# Patient Record
Sex: Female | Born: 1965 | Race: Black or African American | Hispanic: No | Smoking: Never smoker
Health system: Southern US, Community
[De-identification: ages and names within clinical notes are randomized; demographics above are authoritative.]

## PROBLEM LIST (undated history)

## (undated) DIAGNOSIS — K219 Gastro-esophageal reflux disease without esophagitis: Secondary | ICD-10-CM

## (undated) DIAGNOSIS — E669 Obesity, unspecified: Secondary | ICD-10-CM

## (undated) DIAGNOSIS — I219 Acute myocardial infarction, unspecified: Secondary | ICD-10-CM

## (undated) DIAGNOSIS — F32A Depression, unspecified: Secondary | ICD-10-CM

## (undated) DIAGNOSIS — M1990 Unspecified osteoarthritis, unspecified site: Secondary | ICD-10-CM

## (undated) DIAGNOSIS — F329 Major depressive disorder, single episode, unspecified: Secondary | ICD-10-CM

## (undated) DIAGNOSIS — I10 Essential (primary) hypertension: Secondary | ICD-10-CM

---

## 1997-12-20 HISTORY — PX: TUBAL LIGATION: SHX77

## 1999-10-19 ENCOUNTER — Emergency Department (HOSPITAL_COMMUNITY): Admission: EM | Admit: 1999-10-19 | Discharge: 1999-10-19 | Payer: Self-pay | Admitting: Emergency Medicine

## 2001-09-25 ENCOUNTER — Emergency Department (HOSPITAL_COMMUNITY): Admission: EM | Admit: 2001-09-25 | Discharge: 2001-09-26 | Payer: Self-pay | Admitting: Emergency Medicine

## 2002-05-04 ENCOUNTER — Emergency Department (HOSPITAL_COMMUNITY): Admission: EM | Admit: 2002-05-04 | Discharge: 2002-05-05 | Payer: Self-pay | Admitting: *Deleted

## 2002-12-20 HISTORY — PX: FRACTURE SURGERY: SHX138

## 2003-02-24 ENCOUNTER — Emergency Department (HOSPITAL_COMMUNITY): Admission: EM | Admit: 2003-02-24 | Discharge: 2003-02-25 | Payer: Self-pay | Admitting: *Deleted

## 2003-02-25 ENCOUNTER — Encounter: Payer: Self-pay | Admitting: Emergency Medicine

## 2003-03-01 ENCOUNTER — Ambulatory Visit (HOSPITAL_BASED_OUTPATIENT_CLINIC_OR_DEPARTMENT_OTHER): Admission: RE | Admit: 2003-03-01 | Discharge: 2003-03-01 | Payer: Self-pay | Admitting: Orthopedic Surgery

## 2003-03-20 ENCOUNTER — Encounter: Admission: RE | Admit: 2003-03-20 | Discharge: 2003-06-18 | Payer: Self-pay | Admitting: Orthopedic Surgery

## 2003-06-28 ENCOUNTER — Emergency Department (HOSPITAL_COMMUNITY): Admission: EM | Admit: 2003-06-28 | Discharge: 2003-06-28 | Payer: Self-pay | Admitting: Emergency Medicine

## 2003-08-21 ENCOUNTER — Encounter: Payer: Self-pay | Admitting: Internal Medicine

## 2003-08-21 ENCOUNTER — Encounter: Admission: RE | Admit: 2003-08-21 | Discharge: 2003-08-21 | Payer: Self-pay | Admitting: Internal Medicine

## 2003-10-27 ENCOUNTER — Emergency Department (HOSPITAL_COMMUNITY): Admission: EM | Admit: 2003-10-27 | Discharge: 2003-10-28 | Payer: Self-pay | Admitting: Emergency Medicine

## 2003-12-10 ENCOUNTER — Other Ambulatory Visit: Admission: RE | Admit: 2003-12-10 | Discharge: 2003-12-10 | Payer: Self-pay | Admitting: Obstetrics and Gynecology

## 2004-11-11 ENCOUNTER — Emergency Department (HOSPITAL_COMMUNITY): Admission: EM | Admit: 2004-11-11 | Discharge: 2004-11-11 | Payer: Self-pay | Admitting: Emergency Medicine

## 2005-05-20 ENCOUNTER — Emergency Department (HOSPITAL_COMMUNITY): Admission: EM | Admit: 2005-05-20 | Discharge: 2005-05-20 | Payer: Self-pay | Admitting: Emergency Medicine

## 2005-08-26 ENCOUNTER — Emergency Department (HOSPITAL_COMMUNITY): Admission: EM | Admit: 2005-08-26 | Discharge: 2005-08-26 | Payer: Self-pay | Admitting: *Deleted

## 2005-12-20 ENCOUNTER — Encounter (INDEPENDENT_AMBULATORY_CARE_PROVIDER_SITE_OTHER): Payer: Self-pay | Admitting: Internal Medicine

## 2005-12-20 LAB — CONVERTED CEMR LAB

## 2006-05-05 ENCOUNTER — Emergency Department (HOSPITAL_COMMUNITY): Admission: EM | Admit: 2006-05-05 | Discharge: 2006-05-05 | Payer: Self-pay | Admitting: Emergency Medicine

## 2006-07-14 ENCOUNTER — Emergency Department (HOSPITAL_COMMUNITY): Admission: EM | Admit: 2006-07-14 | Discharge: 2006-07-14 | Payer: Self-pay | Admitting: Emergency Medicine

## 2007-01-17 ENCOUNTER — Emergency Department (HOSPITAL_COMMUNITY): Admission: EM | Admit: 2007-01-17 | Discharge: 2007-01-17 | Payer: Self-pay | Admitting: Emergency Medicine

## 2007-03-17 ENCOUNTER — Ambulatory Visit: Payer: Self-pay | Admitting: Internal Medicine

## 2007-03-24 ENCOUNTER — Ambulatory Visit: Payer: Self-pay | Admitting: Internal Medicine

## 2007-04-21 ENCOUNTER — Ambulatory Visit: Payer: Self-pay | Admitting: Internal Medicine

## 2007-07-04 ENCOUNTER — Ambulatory Visit: Payer: Self-pay | Admitting: Internal Medicine

## 2007-07-13 ENCOUNTER — Ambulatory Visit (HOSPITAL_COMMUNITY): Admission: RE | Admit: 2007-07-13 | Discharge: 2007-07-13 | Payer: Self-pay | Admitting: Internal Medicine

## 2007-07-13 ENCOUNTER — Encounter (INDEPENDENT_AMBULATORY_CARE_PROVIDER_SITE_OTHER): Payer: Self-pay | Admitting: Internal Medicine

## 2007-07-14 ENCOUNTER — Ambulatory Visit: Payer: Self-pay | Admitting: Internal Medicine

## 2007-08-18 ENCOUNTER — Ambulatory Visit: Payer: Self-pay | Admitting: *Deleted

## 2007-09-01 ENCOUNTER — Telehealth (INDEPENDENT_AMBULATORY_CARE_PROVIDER_SITE_OTHER): Payer: Self-pay | Admitting: Internal Medicine

## 2007-09-05 ENCOUNTER — Encounter (INDEPENDENT_AMBULATORY_CARE_PROVIDER_SITE_OTHER): Payer: Self-pay | Admitting: Internal Medicine

## 2007-09-05 DIAGNOSIS — E669 Obesity, unspecified: Secondary | ICD-10-CM

## 2007-09-05 DIAGNOSIS — N739 Female pelvic inflammatory disease, unspecified: Secondary | ICD-10-CM | POA: Insufficient documentation

## 2007-09-05 DIAGNOSIS — K219 Gastro-esophageal reflux disease without esophagitis: Secondary | ICD-10-CM | POA: Insufficient documentation

## 2007-09-28 ENCOUNTER — Emergency Department (HOSPITAL_COMMUNITY): Admission: EM | Admit: 2007-09-28 | Discharge: 2007-09-28 | Payer: Self-pay | Admitting: Emergency Medicine

## 2007-10-17 ENCOUNTER — Telehealth (INDEPENDENT_AMBULATORY_CARE_PROVIDER_SITE_OTHER): Payer: Self-pay | Admitting: Internal Medicine

## 2007-11-09 ENCOUNTER — Telehealth (INDEPENDENT_AMBULATORY_CARE_PROVIDER_SITE_OTHER): Payer: Self-pay | Admitting: Internal Medicine

## 2007-11-30 ENCOUNTER — Ambulatory Visit: Payer: Self-pay | Admitting: Internal Medicine

## 2007-11-30 DIAGNOSIS — M7710 Lateral epicondylitis, unspecified elbow: Secondary | ICD-10-CM | POA: Insufficient documentation

## 2007-11-30 DIAGNOSIS — M169 Osteoarthritis of hip, unspecified: Secondary | ICD-10-CM | POA: Insufficient documentation

## 2007-11-30 DIAGNOSIS — R0989 Other specified symptoms and signs involving the circulatory and respiratory systems: Secondary | ICD-10-CM

## 2007-11-30 DIAGNOSIS — R0609 Other forms of dyspnea: Secondary | ICD-10-CM | POA: Insufficient documentation

## 2007-11-30 DIAGNOSIS — M1710 Unilateral primary osteoarthritis, unspecified knee: Secondary | ICD-10-CM | POA: Insufficient documentation

## 2008-01-05 ENCOUNTER — Encounter (INDEPENDENT_AMBULATORY_CARE_PROVIDER_SITE_OTHER): Payer: Self-pay | Admitting: Internal Medicine

## 2008-01-05 ENCOUNTER — Ambulatory Visit (HOSPITAL_BASED_OUTPATIENT_CLINIC_OR_DEPARTMENT_OTHER): Admission: RE | Admit: 2008-01-05 | Discharge: 2008-01-05 | Payer: Self-pay | Admitting: Internal Medicine

## 2008-01-12 ENCOUNTER — Ambulatory Visit: Payer: Self-pay | Admitting: Internal Medicine

## 2008-02-03 ENCOUNTER — Telehealth (INDEPENDENT_AMBULATORY_CARE_PROVIDER_SITE_OTHER): Payer: Self-pay | Admitting: Internal Medicine

## 2008-05-10 ENCOUNTER — Ambulatory Visit: Payer: Self-pay | Admitting: Internal Medicine

## 2008-05-10 DIAGNOSIS — R079 Chest pain, unspecified: Secondary | ICD-10-CM | POA: Insufficient documentation

## 2008-05-10 DIAGNOSIS — I10 Essential (primary) hypertension: Secondary | ICD-10-CM | POA: Insufficient documentation

## 2008-05-10 DIAGNOSIS — F341 Dysthymic disorder: Secondary | ICD-10-CM | POA: Insufficient documentation

## 2008-05-14 ENCOUNTER — Encounter (INDEPENDENT_AMBULATORY_CARE_PROVIDER_SITE_OTHER): Payer: Self-pay | Admitting: Internal Medicine

## 2008-05-19 ENCOUNTER — Encounter (INDEPENDENT_AMBULATORY_CARE_PROVIDER_SITE_OTHER): Payer: Self-pay | Admitting: Internal Medicine

## 2008-05-19 LAB — CONVERTED CEMR LAB
ALT: 15 units/L (ref 0–35)
AST: 15 units/L (ref 0–37)
Albumin: 3.9 g/dL (ref 3.5–5.2)
Basophils Absolute: 0 10*3/uL (ref 0.0–0.1)
Basophils Relative: 0 % (ref 0–1)
CO2: 22 meq/L (ref 19–32)
Cholesterol: 156 mg/dL (ref 0–200)
Creatinine, Ser: 0.59 mg/dL (ref 0.40–1.20)
Eosinophils Absolute: 0.1 10*3/uL (ref 0.0–0.7)
Glucose, Bld: 85 mg/dL (ref 70–99)
HCT: 37.4 % (ref 36.0–46.0)
Hemoglobin: 11.9 g/dL — ABNORMAL LOW (ref 12.0–15.0)
MCV: 82.4 fL (ref 78.0–100.0)
Monocytes Absolute: 0.7 10*3/uL (ref 0.1–1.0)
Neutro Abs: 3.1 10*3/uL (ref 1.7–7.7)
RBC: 4.54 M/uL (ref 3.87–5.11)
Total CHOL/HDL Ratio: 3.8
Total Protein: 7.9 g/dL (ref 6.0–8.3)
VLDL: 29 mg/dL (ref 0–40)
WBC: 7.2 10*3/uL (ref 4.0–10.5)

## 2008-05-20 ENCOUNTER — Ambulatory Visit: Payer: Self-pay | Admitting: Internal Medicine

## 2008-05-21 ENCOUNTER — Encounter (INDEPENDENT_AMBULATORY_CARE_PROVIDER_SITE_OTHER): Payer: Self-pay | Admitting: Internal Medicine

## 2008-06-02 DIAGNOSIS — D509 Iron deficiency anemia, unspecified: Secondary | ICD-10-CM | POA: Insufficient documentation

## 2008-06-02 LAB — CONVERTED CEMR LAB: Vitamin B-12: 619 pg/mL (ref 211–911)

## 2008-07-19 ENCOUNTER — Ambulatory Visit: Payer: Self-pay | Admitting: Internal Medicine

## 2008-07-19 DIAGNOSIS — E785 Hyperlipidemia, unspecified: Secondary | ICD-10-CM

## 2008-07-19 LAB — CONVERTED CEMR LAB
Basophils Absolute: 0 10*3/uL (ref 0.0–0.1)
Eosinophils Absolute: 0.2 10*3/uL (ref 0.0–0.7)
HCT: 38.6 % (ref 36.0–46.0)
Hemoglobin: 12.9 g/dL (ref 12.0–15.0)
Lymphs Abs: 3 10*3/uL (ref 0.7–4.0)
MCV: 82 fL (ref 78.0–100.0)
Monocytes Absolute: 0.4 10*3/uL (ref 0.1–1.0)
Neutrophils Relative %: 47 % (ref 43–77)
OCCULT 1: NEGATIVE
OCCULT 2: NEGATIVE
RDW: 16.6 % — ABNORMAL HIGH (ref 11.5–15.5)

## 2008-07-27 ENCOUNTER — Encounter (INDEPENDENT_AMBULATORY_CARE_PROVIDER_SITE_OTHER): Payer: Self-pay | Admitting: Internal Medicine

## 2008-09-30 ENCOUNTER — Telehealth (INDEPENDENT_AMBULATORY_CARE_PROVIDER_SITE_OTHER): Payer: Self-pay | Admitting: Internal Medicine

## 2008-10-01 ENCOUNTER — Encounter (INDEPENDENT_AMBULATORY_CARE_PROVIDER_SITE_OTHER): Payer: Self-pay | Admitting: Internal Medicine

## 2008-10-01 ENCOUNTER — Ambulatory Visit: Payer: Self-pay | Admitting: Nurse Practitioner

## 2008-10-01 DIAGNOSIS — N898 Other specified noninflammatory disorders of vagina: Secondary | ICD-10-CM | POA: Insufficient documentation

## 2008-10-01 LAB — CONVERTED CEMR LAB
Bilirubin Urine: NEGATIVE
Blood in Urine, dipstick: NEGATIVE
Ketones, urine, test strip: NEGATIVE
Specific Gravity, Urine: >=1.03
Whiff Test: NEGATIVE
pH: 5

## 2008-10-09 ENCOUNTER — Encounter (INDEPENDENT_AMBULATORY_CARE_PROVIDER_SITE_OTHER): Payer: Self-pay | Admitting: *Deleted

## 2008-10-28 ENCOUNTER — Telehealth (INDEPENDENT_AMBULATORY_CARE_PROVIDER_SITE_OTHER): Payer: Self-pay | Admitting: Internal Medicine

## 2008-10-29 ENCOUNTER — Ambulatory Visit: Payer: Self-pay | Admitting: Internal Medicine

## 2008-11-25 ENCOUNTER — Telehealth (INDEPENDENT_AMBULATORY_CARE_PROVIDER_SITE_OTHER): Payer: Self-pay | Admitting: Internal Medicine

## 2008-12-08 ENCOUNTER — Emergency Department (HOSPITAL_COMMUNITY): Admission: EM | Admit: 2008-12-08 | Discharge: 2008-12-08 | Payer: Self-pay | Admitting: Emergency Medicine

## 2008-12-09 ENCOUNTER — Telehealth (INDEPENDENT_AMBULATORY_CARE_PROVIDER_SITE_OTHER): Payer: Self-pay | Admitting: Internal Medicine

## 2008-12-10 ENCOUNTER — Ambulatory Visit: Payer: Self-pay | Admitting: Nurse Practitioner

## 2008-12-10 DIAGNOSIS — N939 Abnormal uterine and vaginal bleeding, unspecified: Secondary | ICD-10-CM

## 2008-12-10 DIAGNOSIS — N926 Irregular menstruation, unspecified: Secondary | ICD-10-CM | POA: Insufficient documentation

## 2008-12-10 LAB — CONVERTED CEMR LAB
Basophils Absolute: 0 10*3/uL (ref 0.0–0.1)
Basophils Relative: 0 % (ref 0–1)
Lymphocytes Relative: 40 % (ref 12–46)
MCHC: 32 g/dL (ref 30.0–36.0)
Neutro Abs: 3.1 10*3/uL (ref 1.7–7.7)
Neutrophils Relative %: 47 % (ref 43–77)
Platelets: 456 10*3/uL — ABNORMAL HIGH (ref 150–400)
RDW: 15.1 % (ref 11.5–15.5)

## 2008-12-24 ENCOUNTER — Ambulatory Visit (HOSPITAL_COMMUNITY): Admission: RE | Admit: 2008-12-24 | Discharge: 2008-12-24 | Payer: Self-pay | Admitting: Internal Medicine

## 2009-01-07 ENCOUNTER — Ambulatory Visit: Payer: Self-pay | Admitting: Internal Medicine

## 2009-01-29 ENCOUNTER — Encounter (INDEPENDENT_AMBULATORY_CARE_PROVIDER_SITE_OTHER): Payer: Self-pay | Admitting: Internal Medicine

## 2009-02-13 ENCOUNTER — Encounter (INDEPENDENT_AMBULATORY_CARE_PROVIDER_SITE_OTHER): Payer: Self-pay | Admitting: Internal Medicine

## 2009-02-28 ENCOUNTER — Encounter (INDEPENDENT_AMBULATORY_CARE_PROVIDER_SITE_OTHER): Payer: Self-pay | Admitting: Internal Medicine

## 2009-03-10 ENCOUNTER — Telehealth (INDEPENDENT_AMBULATORY_CARE_PROVIDER_SITE_OTHER): Payer: Self-pay | Admitting: Internal Medicine

## 2009-03-14 ENCOUNTER — Telehealth (INDEPENDENT_AMBULATORY_CARE_PROVIDER_SITE_OTHER): Payer: Self-pay | Admitting: Internal Medicine

## 2009-03-20 ENCOUNTER — Encounter (INDEPENDENT_AMBULATORY_CARE_PROVIDER_SITE_OTHER): Payer: Self-pay | Admitting: *Deleted

## 2009-04-03 ENCOUNTER — Emergency Department (HOSPITAL_COMMUNITY): Admission: EM | Admit: 2009-04-03 | Discharge: 2009-04-03 | Payer: Self-pay | Admitting: Family Medicine

## 2009-06-12 ENCOUNTER — Ambulatory Visit: Payer: Self-pay | Admitting: Obstetrics and Gynecology

## 2009-06-12 ENCOUNTER — Other Ambulatory Visit: Admission: RE | Admit: 2009-06-12 | Discharge: 2009-06-12 | Payer: Self-pay | Admitting: Obstetrics and Gynecology

## 2009-06-19 ENCOUNTER — Ambulatory Visit: Payer: Self-pay | Admitting: Internal Medicine

## 2009-08-06 ENCOUNTER — Emergency Department (HOSPITAL_COMMUNITY): Admission: EM | Admit: 2009-08-06 | Discharge: 2009-08-06 | Payer: Self-pay | Admitting: Emergency Medicine

## 2009-10-27 ENCOUNTER — Encounter (INDEPENDENT_AMBULATORY_CARE_PROVIDER_SITE_OTHER): Payer: Self-pay | Admitting: Internal Medicine

## 2009-10-27 DIAGNOSIS — D259 Leiomyoma of uterus, unspecified: Secondary | ICD-10-CM | POA: Insufficient documentation

## 2010-01-01 ENCOUNTER — Ambulatory Visit: Payer: Self-pay | Admitting: Internal Medicine

## 2010-01-22 LAB — CONVERTED CEMR LAB
ALT: 14 units/L (ref 0–35)
Albumin: 4 g/dL (ref 3.5–5.2)
Alkaline Phosphatase: 78 units/L (ref 39–117)
Calcium: 9.2 mg/dL (ref 8.4–10.5)
Eosinophils Relative: 3 % (ref 0–5)
Glucose, Bld: 74 mg/dL (ref 70–99)
HCT: 35 % — ABNORMAL LOW (ref 36.0–46.0)
Lymphs Abs: 3 10*3/uL (ref 0.7–4.0)
MCHC: 32.3 g/dL (ref 30.0–36.0)
Monocytes Relative: 10 % (ref 3–12)
Neutro Abs: 3.8 10*3/uL (ref 1.7–7.7)
Potassium: 4.8 meq/L (ref 3.5–5.3)
RBC: 4.37 M/uL (ref 3.87–5.11)
Total Bilirubin: 0.3 mg/dL (ref 0.3–1.2)
Total Protein: 7.8 g/dL (ref 6.0–8.3)
WBC: 7.8 10*3/uL (ref 4.0–10.5)

## 2010-01-23 ENCOUNTER — Encounter (INDEPENDENT_AMBULATORY_CARE_PROVIDER_SITE_OTHER): Payer: Self-pay | Admitting: *Deleted

## 2010-02-03 ENCOUNTER — Emergency Department (HOSPITAL_COMMUNITY): Admission: EM | Admit: 2010-02-03 | Discharge: 2010-02-03 | Payer: Self-pay | Admitting: Emergency Medicine

## 2010-02-05 ENCOUNTER — Ambulatory Visit: Payer: Self-pay | Admitting: Internal Medicine

## 2010-02-05 LAB — CONVERTED CEMR LAB
Bilirubin Urine: NEGATIVE
Blood in Urine, dipstick: NEGATIVE
GC Probe Amp, Genital: NEGATIVE
Glucose, Urine, Semiquant: NEGATIVE
Ketones, urine, test strip: NEGATIVE
Nitrite: NEGATIVE
Urobilinogen, UA: 0.2
WBC Urine, dipstick: NEGATIVE
Whiff Test: NEGATIVE
pH: 5

## 2010-02-06 ENCOUNTER — Ambulatory Visit: Payer: Self-pay | Admitting: Internal Medicine

## 2010-02-09 ENCOUNTER — Ambulatory Visit: Payer: Self-pay | Admitting: Internal Medicine

## 2010-02-23 ENCOUNTER — Encounter (INDEPENDENT_AMBULATORY_CARE_PROVIDER_SITE_OTHER): Payer: Self-pay | Admitting: Internal Medicine

## 2010-02-23 ENCOUNTER — Ambulatory Visit: Payer: Self-pay | Admitting: Nurse Practitioner

## 2010-02-25 ENCOUNTER — Ambulatory Visit: Payer: Self-pay | Admitting: Internal Medicine

## 2010-03-05 ENCOUNTER — Ambulatory Visit: Payer: Self-pay | Admitting: Internal Medicine

## 2010-03-26 ENCOUNTER — Telehealth (INDEPENDENT_AMBULATORY_CARE_PROVIDER_SITE_OTHER): Payer: Self-pay | Admitting: Internal Medicine

## 2010-03-26 ENCOUNTER — Ambulatory Visit: Payer: Self-pay | Admitting: Internal Medicine

## 2010-04-01 ENCOUNTER — Emergency Department (HOSPITAL_COMMUNITY): Admission: EM | Admit: 2010-04-01 | Discharge: 2010-04-01 | Payer: Self-pay | Admitting: Emergency Medicine

## 2010-04-07 ENCOUNTER — Encounter: Admission: RE | Admit: 2010-04-07 | Discharge: 2010-05-12 | Payer: Self-pay | Admitting: Orthopedic Surgery

## 2010-04-23 ENCOUNTER — Ambulatory Visit: Payer: Self-pay | Admitting: Internal Medicine

## 2010-05-11 ENCOUNTER — Ambulatory Visit: Payer: Self-pay | Admitting: Internal Medicine

## 2010-05-28 ENCOUNTER — Ambulatory Visit: Payer: Self-pay | Admitting: Internal Medicine

## 2010-05-28 DIAGNOSIS — J019 Acute sinusitis, unspecified: Secondary | ICD-10-CM | POA: Insufficient documentation

## 2010-06-09 ENCOUNTER — Ambulatory Visit: Payer: Self-pay | Admitting: Internal Medicine

## 2010-06-30 ENCOUNTER — Ambulatory Visit: Payer: Self-pay | Admitting: Internal Medicine

## 2010-06-30 DIAGNOSIS — R071 Chest pain on breathing: Secondary | ICD-10-CM

## 2010-06-30 DIAGNOSIS — M76899 Other specified enthesopathies of unspecified lower limb, excluding foot: Secondary | ICD-10-CM | POA: Insufficient documentation

## 2010-06-30 LAB — CONVERTED CEMR LAB: LDL Goal: 160 mg/dL

## 2010-07-21 ENCOUNTER — Ambulatory Visit: Payer: Self-pay | Admitting: Internal Medicine

## 2010-07-26 ENCOUNTER — Emergency Department: Payer: Self-pay | Admitting: Emergency Medicine

## 2010-08-17 ENCOUNTER — Ambulatory Visit: Payer: Self-pay | Admitting: Internal Medicine

## 2010-08-20 ENCOUNTER — Ambulatory Visit: Payer: Self-pay | Admitting: Physician Assistant

## 2010-09-18 ENCOUNTER — Telehealth (INDEPENDENT_AMBULATORY_CARE_PROVIDER_SITE_OTHER): Payer: Self-pay | Admitting: Internal Medicine

## 2010-11-27 ENCOUNTER — Ambulatory Visit: Payer: Self-pay | Admitting: Internal Medicine

## 2011-01-06 ENCOUNTER — Ambulatory Visit: Admit: 2011-01-06 | Payer: Self-pay | Admitting: Internal Medicine

## 2011-01-10 ENCOUNTER — Encounter: Payer: Self-pay | Admitting: Internal Medicine

## 2011-01-19 NOTE — Assessment & Plan Note (Signed)
Summary: stomach pain//gk   Vital Signs:  Patient profile:   45 year old female Weight:      252 pounds BMI:     48.58 Temp:     97.5 degrees F Pulse rate:   80 / minute Pulse rhythm:   regular Resp:     20 per minute BP sitting:   186 / 100  (left arm) Cuff size:   large  Vitals Entered By: Vesta Mixer CMA (January 01, 2010 11:20 AM)           CC: Pt is here for stomach pain she staes she has been having the pain since christmas. During christmas she had heavy bleeding from her cycle it stayed on 10 days and she was hurting very bad.. Pt states her period came back  on 3days ago she feels its too soon for her cycle. She only had about 6 or 7 days before it came on again.  Pt states she would like to change her medication for her joints, the Tramadol, she feels the medication isnt doing anything for her.   Is Patient Diabetic? No Pain Assessment Patient in pain? yes     Location: abdomen Intensity: 6 Type: sharp  Does patient need assistance? Functional Status Self care Ambulation Normal   CC:  Pt is here for stomach pain she staes she has been having the pain since christmas. During christmas she had heavy bleeding from her cycle it stayed on 10 days and she was hurting very bad.. Pt states her period came back  on 3days ago she feels its too soon for her cycle. She only had about 6 or 7 days before it came on again.  Pt states she would like to change her medication for her joints, the Tramadol, and she feels the medication isnt doing anything for her.  .  History of Present Illness: 1. Abdominal discomfort/ heavy menstrual bleeding:  Pt. was being followed by Dr. Nicholaus Bloom at  [facility] for this, but her orange card expired and she did not finish up.  Sounds like she did have the endometrial biopsy.  She is not aware of what the biopsy showed.  Pt. states the problems she is having since Christmas is similar to that suffered previously.    2.  Arthritis of hips and  knees:  Went to Nutrition once since sent in summer.  No follow ups.  Pt. not really working on diet.   Naproxen has not helped in past.  Vicodin she would break in half and just take at bedtime.  Tramadol caused nausea and stomach discomfort plus did not help the pain.  3.  Hypertension:  just started back on Avalide in December sometime--had not taken for months.  No chest pain or swelling of ankles.  Allergies (verified): No Known Drug Allergies  Physical Exam  Lungs:  Normal respiratory effort, chest expands symmetrically. Lungs are clear to auscultation, no crackles or wheezes. Heart:  Normal rate and regular rhythm. S1 and S2 normal without gallop, murmur, click, rub or other extra sounds. Extremities:  No edema   Impression & Recommendations:  Problem # 1:  ABNORMAL VAGINAL BLEEDING (ICD-626.9) Refer back to Gynecology.  Problem # 2:  HYPERTENSION (ICD-401.9) Has just recently restarted meds. Her updated medication list for this problem includes:    Avalide 150-12.5 Mg Tabs (Irbesartan-hydrochlorothiazide) .Marland Kitchen... 1tab by mouth once daily  Orders: UA Dipstick w/o Micro (manual) ([address])  Problem # 3:  ANEMIA, IRON DEFICIENCY (ICD-280.9)  Her updated medication list for this problem includes:    Ferrous Sulfate 325 (65 Fe) Mg Tbec (Ferrous sulfate) .Marland Kitchen... 1 tab by mouth daily--not taking  Orders: T-CBC w/Diff ([address])  Problem # 4:  DEGENERATIVE JOINT DISEASE, KNEES, BILATERAL (ICD-715.96) And Hips Switch to Celebrex as long as kidney function fine--pt. to hold med until hears from me. The following medications were removed from the medication list:    Tramadol Hcl 50 Mg Tabs (Tramadol hcl) .Marland Kitchen... 1 tab by mouth two times a day Her updated medication list for this problem includes:    Adult Aspirin Ec Low Strength 81 Mg Tbec (Aspirin) .Marland Kitchen... 1 tab by mouth daily with food    Celebrex 200 Mg Caps (Celecoxib) .Marland Kitchen... 1 cap by mouth daily as needed knee and hip  pain  Complete Medication List: 1)  Avalide 150-12.5 Mg Tabs (Irbesartan-hydrochlorothiazide) .Marland Kitchen.. 1tab by mouth once daily 2)  Protonix 40 Mg Tbec (Pantoprazole sodium) .Marland Kitchen.. 1 tab by mouth two times a day for 2 weeks, then once daily 3)  Adult Aspirin Ec Low Strength 81 Mg Tbec (Aspirin) .Marland Kitchen.. 1 tab by mouth daily with food 4)  Ferrous Sulfate 325 (65 Fe) Mg Tbec (Ferrous sulfate) .Marland Kitchen.. 1 tab by mouth daily 5)  Ventolin Hfa 108 (90 Base) Mcg/act Aers (Albuterol sulfate) .... 2 puffs q 4 hours as needed dyspnea 6)  Celebrex 200 Mg Caps (Celecoxib) .Marland Kitchen.. 1 cap by mouth daily as needed knee and hip pain  Other Orders: T-Comprehensive Metabolic Panel ([id number])  Patient Instructions: 1)  BP check in 1 month--nurse visit only 2)  CPP next available with Dr. Delrae Alfred. 3)  Call and let Dr. Delrae Alfred know in next 2 weeks whether you want a Nutrition referral or not. Prescriptions: PROTONIX 40 MG  TBEC (PANTOPRAZOLE SODIUM) 1 tab by mouth two times a day for 2 weeks, then once daily  #60 x 11   Entered and Authorized by:   Julieanne Manson MD   Signed by:   Julieanne Manson MD on 01/01/2010   Method used:   Faxed to ...       [facility] - [facility] (retail)       [address] [place], [place]  [address]       Ph: [phone]       Fax: [phone]   RxID:   [id number] AVALIDE 150-12.5 MG  TABS (IRBESARTAN-HYDROCHLOROTHIAZIDE) 1tab by mouth once daily  #30 x 11   Entered and Authorized by:   Julieanne Manson MD   Signed by:   Julieanne Manson MD on 01/01/2010   Method used:   Faxed to ...       [facility] - [facility] (retail)       [address] [place], [place]  [address]       Ph: [phone]       Fax: [phone]   RxID:   [id number] CELEBREX 200 MG CAPS (CELECOXIB) 1 cap by mouth daily as needed knee and hip pain  #30 x 2   Entered and Authorized by:   Julieanne Manson MD   Signed by:    Julieanne Manson MD on 01/01/2010   Method used:   Faxed to ...       [facility] - Pharmac (retail)       [address] [place], [place]  [address]  Ph: [phone]       Fax: [phone]   RxID:   [id number]    Vital Signs:  Patient profile:   45 year old female Weight:      252 pounds BMI:     48.58 Temp:     97.5 degrees F Pulse rate:   80 / minute Pulse rhythm:   regular Resp:     20 per minute BP sitting:   186 / 100  (left arm) Cuff size:   large  Vitals Entered By: Vesta Mixer CMA (January 01, 2010 11:20 AM)  Appended Document: stomach pain//gk  Laboratory Results   Urine Tests   Date/Time Reported: January 01, 2010 3:47 PM  Routine Urinalysis   Color: lt. yellow Appearance: Clear Glucose: negative   (Normal Range: Negative) Bilirubin: negative   (Normal Range: Negative) Ketone: negative   (Normal Range: Negative) Spec. Gravity: 1.025   (Normal Range: 1.003-1.035) Blood: large   (Normal Range: Negative) pH: 6.0   (Normal Range: 5.0-8.0) Protein: negative   (Normal Range: Negative) Urobilinogen: 0.2   (Normal Range: 0-1) Nitrite: negative   (Normal Range: Negative) Leukocyte Esterace: negative   (Normal Range: Negative)    Comments: Pt. currently menstruating     Laboratory Results   Urine Tests    Routine Urinalysis   Color: lt. yellow Appearance: Clear Glucose: negative   (Normal Range: Negative) Bilirubin: negative   (Normal Range: Negative) Ketone: negative   (Normal Range: Negative) Spec. Gravity: 1.025   (Normal Range: 1.003-1.035) Blood: large   (Normal Range: Negative) pH: 6.0   (Normal Range: 5.0-8.0) Protein: negative   (Normal Range: Negative) Urobilinogen: 0.2   (Normal Range: 0-1) Nitrite: negative   (Normal Range: Negative) Leukocyte Esterace: negative   (Normal Range: Negative)    Comments: Pt. currently menstruating

## 2011-01-19 NOTE — Progress Notes (Signed)
  Phone Note From Pharmacy   Summary of Call: Request for Tramadol--has not been filled since 02/05/10--need to know why she wants to refill at this point.  Please call and inquire. Initial call taken by: Julieanne Manson MD,  September 18, 2010 8:43 AM  Follow-up for Phone Call        [facility]  Follow-up by: Michelle Nasuti,  September 18, 2010 5:08 PM  Additional Follow-up for Phone Call Additional follow up Details #1::        PT ONLY TAKES MEDS as needed  Additional Follow-up by: Michelle Nasuti,  September 21, 2010 12:29 PM    Additional Follow-up for Phone Call Additional follow up Details #2::    But  I need to know for what pain is she using it?  Julieanne Manson MD  September 23, 2010 1:27 PM   Additional Follow-up for Phone Call Additional follow up Details #3:: Details for Additional Follow-up Action Taken: pts states she using the meds for her R hip/leg pain. Cosandra Plouffe was the one who prescribed the meds originally.   Will go ahead and fill  Julieanne Manson MD  September 25, 2010 2:58 PM    pt aware.... [place] Shannon  September 25, 2010 4:50 PM  Additional Follow-up by: Michelle Nasuti,  September 24, 2010 12:36 PM  Prescriptions: TRAMADOL HCL 50 MG TABS (TRAMADOL HCL) 1 tab by mouth two times a day as needed pain  #60 x 0   Entered and Authorized by:   Julieanne Manson MD   Signed by:   Julieanne Manson MD on 09/25/2010   Method used:   Printed then faxed to ...       [facility] - [facility] (retail)       [address] [place], [place]  [address]       Ph: [phone]       Fax: [phone]   RxID:   [id number]

## 2011-01-19 NOTE — Assessment & Plan Note (Signed)
Summary: 1 month fu on depression//kt   Vital Signs:  Patient profile:   45 year old female Weight:      261 pounds Temp:     98.0 degrees F Pulse rate:   93 / minute Pulse rhythm:   regular Resp:     20 per minute BP sitting:   151 / 90  (left arm) Cuff size:   large  Vitals Entered By: Vesta Mixer CMA (June 30, 2010 3:26 PM) CC: depression f/u.  Started back on celexa, missed an appt with Marchelle Folks, but is scheduled to see her next week., Lipid Management Is Patient Diabetic? No Pain Assessment Patient in pain? yes     Location: legs/chest  Does patient need assistance? Ambulation Normal   CC:  depression f/u.  Started back on celexa, missed an appt with Marchelle Folks, but is scheduled to see her next week., and Lipid Management.  History of Present Illness: 1.  Symptoms of sinusitis did get better with treatment.  Have not recurred with restart of Celexa.  2.  Depression:  Restarted Celexa.  Sleeps well.  Refreshed in morning.  Most of time looks forward to day.  Not working any longer and stress level has decreased.  Is getting out and doing things[facility] and trying to be physically active.  No suicidal ideation.  Forgets things all the time--appointments.    3.  High upper left chest pain:  started a couple of days ago.  Kept her up a bit last night.  Has been helping her mom move recently before this started.  No radiation.  No associated dyspnea.  Has been missing bp med.  Taking asa 81 mg daily.  Lipid Management History:      Positive NCEP/ATP III risk factors include hypertension.  Negative NCEP/ATP III risk factors include female age less than 23 years old, no family history for ischemic heart disease, and non-tobacco-user status.    Allergies (verified): No Known Drug Allergies  Physical Exam  General:  Morbidly obese. Chest Wall:  Tender over high left chest in area of complaint--similar pain. Lungs:  Normal respiratory effort, chest expands symmetrically.  Lungs are clear to auscultation, no crackles or wheezes. Heart:  Normal rate and regular rhythm. S1 and S2 normal without gallop, murmur, click, rub or other extra sounds. Msk:  Tender over right greater trochanter.   Impression & Recommendations:  Problem # 1:  ANXIETY DEPRESSION (ICD-300.4) Not well controlled Increase Celexa to 20 mg  Problem # 2:  BURSITIS, RIGHT HIP (ICD-726.5) Discussed stretching exercises and water exercises  Problem # 3:  CHEST WALL PAIN, ACUTE (ICD-786.52) To try Ibuprofen--call if worse Her updated medication list for this problem includes:    Adult Aspirin Ec Low Strength 81 Mg Tbec (Aspirin) .Marland Kitchen... 1 tab by mouth daily with food  Complete Medication List: 1)  Avalide 150-12.5 Mg Tabs (Irbesartan-hydrochlorothiazide) .Marland Kitchen.. 1tab by mouth once daily 2)  Protonix 40 Mg Tbec (Pantoprazole sodium) .Marland Kitchen.. 1 tab by mouth two times a day for 2 weeks, then once daily 3)  Adult Aspirin Ec Low Strength 81 Mg Tbec (Aspirin) .Marland Kitchen.. 1 tab by mouth daily with food 4)  Ferrous Sulfate 325 (65 Fe) Mg Tbec (Ferrous sulfate) .Marland Kitchen.. 1 tab by mouth daily 5)  One-a-day Weight Smart Advance Tabs (Multiple vitamins-minerals) 6)  Tramadol Hcl 50 Mg Tabs (Tramadol hcl) .Marland Kitchen.. 1 tab by mouth two times a day as needed pain 7)  Azithromycin 250 Mg Tabs (Azithromycin) .... 2  tabs by mouth daily for 6 days 8)  Fluticasone Propionate 50 Mcg/act Susp (Fluticasone propionate) .... 2 sprays each nostril daily 9)  Xyzal 5 Mg Tabs (Levocetirizine dihydrochloride) .Marland Kitchen.. 1 tab by mouth daily 10)  Citalopram Hydrobromide 20 Mg Tabs (Citalopram hydrobromide) .Marland Kitchen.. 1 tab by mouth daily  Lipid Assessment/Plan:      Based on NCEP/ATP III, the patient's risk factor category is "0-1 risk factors".  The patient's lipid goals are as follows: Total cholesterol goal is 200; LDL cholesterol goal is 160; HDL cholesterol goal is 40; Triglyceride goal is 150.     Patient Instructions: 1)  Take Ibuprofen 600 mg with  food every 6 hours for chest discomfort--if worsens, call or go to ED 2)  Follow up with Dr. Delrae Alfred in 1 month --depression and hypertension Prescriptions: CITALOPRAM HYDROBROMIDE 20 MG TABS (CITALOPRAM HYDROBROMIDE) 1 tab by mouth daily  #30 x 6   Entered and Authorized by:   Julieanne Manson MD   Signed by:   Julieanne Manson MD on 06/30/2010   Method used:   Faxed to ...       [facility] - Pharmac (retail)       [address] [place], [place]  [address]       Ph: [phone]       Fax: [phone]   RxID:   [id number]

## 2011-01-19 NOTE — Progress Notes (Signed)
Summary: Office Visit//DEPRESSION SCREENING  Office Visit//DEPRESSION SCREENING   Imported By: Arta Bruce 04/02/2010 12:14:44  _____________________________________________________________________  External Attachment:    Type:   Image     Comment:   External Document

## 2011-01-19 NOTE — Letter (Signed)
Summary: *HSN Results Follow up  [facility]  [address] [place], [place] [address]   Phone: [phone]  Fax: [phone]      01/23/2010   Whitney Boyer [address] [place], [place]  [address]   Dear  Ms. Whitney Boyer,                            ____S.Drinkard,FNP   ____D. Gore,FNP       ____B. McPherson,MD   ____V. Rankins,MD    __xx__E. Mulberry,MD    ____N. Daphine Deutscher, FNP  ____D. Reche Dixon, MD    ____K. Philipp Deputy, MD    ____Other     This letter is to inform you that your recent test(s):  _______Pap Smear    ___xx____Lab Test     _______X-ray    _______ is within acceptable limits  ___xx____ requires a medication change  _______ requires a follow-up lab visit  _______ requires a follow-up visit with your provider   Comments:  You have some mild anemia and a prescription for iron was sent to the [facility] pharmacy.  If you have any questions please give [place] a call.  Thank you.       _________________________________________________________ If you have any questions, please contact our office                     Sincerely,  Vesta Mixer CMA [facility]

## 2011-01-19 NOTE — Letter (Signed)
Summary: AMANDA SUMMARY  AMANDA SUMMARY   Imported By: Arta Bruce 05/14/2010 12:24:45  _____________________________________________________________________  External Attachment:    Type:   Image     Comment:   External Document

## 2011-01-19 NOTE — Assessment & Plan Note (Signed)
Summary: CPP EXAM//GK   Vital Signs:  Patient profile:   45 year old female LMP:     01/26/2010 Weight:      249 pounds Temp:     97.9 degrees F Pulse rate:   88 / minute Pulse rhythm:   regular Resp:     20 per minute Cuff size:   large  Vitals Entered By: Vesta Mixer CMA (February 05, 2010 12:29 PM) CC: CPP Is Patient Diabetic? No Pain Assessment Patient in pain? yes     Location: back Intensity: 5  Does patient need assistance? Ambulation Normal LMP (date): 01/26/2010     Enter LMP: 01/26/2010 Last PAP Result Done   CC:  CPP.  History of Present Illness: 45 yo female here for CPP:  1.  Iron Deficiency Anemia with heavy periods.  Did not yet pick up iron supplement.  Was not aware she [facility]--unable to leave a message on her voicemail regarding this recently.  2.  Knee pain:  would like to go back on Tramadol.  Realized after last visit that she had taken Celebrex previously and did not like the med.  Cannot say why.  Habits & Providers  Alcohol-Tobacco-Diet     Alcohol drinks/day: 0     Tobacco Status: never  Exercise-Depression-Behavior     Drug Use: marijuana--when much younger  Allergies (verified): No Known Drug Allergies  Past History:  Past Medical History: ENCOUNTER FOR LONG-TERM USE OF OTHER MEDICATIONS (ICD-V58.69) ABNORMAL VAGINAL BLEEDING (ICD-626.9) VAGINAL DISCHARGE (ICD-623.5) DYSLIPIDEMIA (ICD-272.4) ANEMIA, IRON DEFICIENCY (ICD-280.9) HYPERTENSION (ICD-401.9) ANXIETY DEPRESSION (ICD-300.4) CHEST PAIN (ICD-786.50) LATERAL EPICONDYLITIS, RIGHT (ICD-726.32) DEGENERATIVE JOINT DISEASE, KNEES, 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  1999:  BTL  Family History: Mother,  39:  Hypertension, asthma Father,died in  30s,  when pt. was young--not sure what was cause of death. 6 Brothers:  1 with hypertension, OSA.  1 with asthma, rest she thinks are healthy 3 Sisters:  1 with hypercholesterolemia, htn, arthritis, gout, GERD.  Other 2 are fairly healthy. 3 Children, 1 died at age 24 yo in a house fire.  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  [facility] Use:  marijuana--when much younger  Review of Systems General:  Energy fair.. Eyes:  Denies blurring. ENT:  Sometimes feels like she doesn't hear things--often turns TV up loudly.. CV:  Denies chest pain or discomfort and palpitations. Resp:  Complains of shortness of breath; if gets too hot, going up steps.Marland Kitchen GI:  Denies abdominal pain, dark tarry stools, and diarrhea; blood on tissue when wiped--one month ago with a loose stool--did not note in stool or toilet  Occasional constipation.. GU:  Denies dysuria; urinary urge Maybe scant vaginal discharge--no odor.  Clear.. MS:  Chronic knee, hip and back pain. Derm:  Denies lesion(s) and rash. Neuro:  Denies numbness, tingling, and weakness. Psych:  Depressed at times.  If did not have children, probably would not care about anything.  Would consider suicide if not for her children.  Has not ever taken anything long term for depression.  Has felt this way all of her life.  Cut wrists when younger, but never hospitalized.  Took overdose of pills once  and hospitalized, but nothing came of it.  Can have a lot of associated anxiety.  .  Physical Exam  General:  obese, NAD Head:  Normocephalic and atraumatic without obvious abnormalities. No apparent alopecia or balding. Eyes:  No corneal or conjunctival inflammation noted. EOMI. Perrla. Funduscopic exam benign, without hemorrhages, exudates or papilledema. Vision grossly normal. Ears:  External ear exam shows no  significant lesions or deformities.  Otoscopic examination reveals clear canals, tympanic membranes are intact bilaterally without bulging, retraction, inflammation or discharge. Hearing is grossly normal bilaterally. Nose:  External nasal examination shows no deformity or inflammation. Nasal mucosa are pink and moist without lesions or exudates. Mouth:  Oral mucosa and oropharynx without lesions or exudates.   Neck:  No deformities, masses, or tenderness noted. Breasts:  No mass, nodules, thickening, tenderness, bulging, retraction, inflamation, nipple discharge or skin changes noted.   Lungs:  Normal respiratory effort, chest expands symmetrically. Lungs are clear to auscultation, no crackles or wheezes. Heart:  Normal rate and regular rhythm. S1 and S2 normal without gallop, murmur, click, rub or other extra sounds. Abdomen:  Bowel sounds positive,abdomen soft and non-tender without masses, organomegaly or hernias noted. Rectal:  No external abnormalities noted. Normal sphincter tone. No rectal masses or tenderness.  Heme negative light brown stool. Genitalia:  Pelvic Exam:        External: normal female genitalia without lesions or masses        Vagina: normal without lesions or masses.  Thin white discharge        Cervix: normal without lesions or masses        Adnexa: normal bimanual exam without masses or fullness        Uterus: normal by palpation        Pap smear: performed Msk:  No deformity or scoliosis noted of thoracic or lumbar spine.   Pulses:  R and L carotid,radial,femoral,dorsalis pedis and posterior tibial pulses are full and equal bilaterally Extremities:  No clubbing, cyanosis, edema, or deformity noted with normal full range of motion of all joints.   Neurologic:  No cranial nerve deficits noted. Station and gait are normal. Plantar reflexes are down-going bilaterally. DTRs are symmetrical throughout. Sensory, motor and coordinative functions appear intact. Skin:  Intact  without suspicious lesions or rashes Cervical Nodes:  No lymphadenopathy noted Axillary Nodes:  No palpable lymphadenopathy Inguinal Nodes:  No significant adenopathy Psych:  Cognition and judgment appear intact. Alert and cooperative with normal attention span and concentration. No apparent delusions, illusions, hallucinations   Impression & Recommendations:  Problem # 1:  ROUTINE GYNECOLOGICAL EXAMINATION (ICD-V72.31) Mammogram scheduled Orders: KOH/ WET Mount [phone]) Pap Smear, Thin Prep ( Collection of) [phone]) UA Dipstick w/o Micro (automated)  (81003) T- GC Chlamydia (09811) T-HIV Antibody  (Reflex) ([id number]) T-Pap Smear, Thin Prep (13086)  Problem # 2:  Preventive Health Care (ICD-V70.0) Guaiac Cards x3 to return in 2 weeks. Refuses flu vaccine Cholesterol panel fine in 2009  Problem # 3:  ANEMIA, IRON DEFICIENCY (ICD-280.9) Heavy periods To take iron supplement Her updated medication list for this problem includes:    Ferrous Sulfate 325 (65 Fe) Mg Tbec (Ferrous sulfate) .Marland Kitchen... 1 tab by mouth daily  Problem # 4:  ANXIETY DEPRESSION (ICD-300.4) Start Citalopram Orders: Psychology Referral (Psychology)  Complete Medication List: 1)  Avalide 150-12.5 Mg Tabs (Irbesartan-hydrochlorothiazide) .Marland Kitchen.. 1tab by mouth once daily 2)  Protonix 40 Mg Tbec (Pantoprazole sodium) .Marland Kitchen.. 1 tab by mouth two times a day for 2 weeks,  then once daily 3)  Adult Aspirin Ec Low Strength 81 Mg Tbec (Aspirin) .Marland Kitchen.. 1 tab by mouth daily with food 4)  Ferrous Sulfate 325 (65 Fe) Mg Tbec (Ferrous sulfate) .Marland Kitchen.. 1 tab by mouth daily 5)  One-a-day Weight Smart Advance Tabs (Multiple vitamins-minerals) 6)  Citalopram Hydrobromide 10 Mg Tabs (Citalopram hydrobromide) .Marland Kitchen.. 1 tab by mouth daily 7)  Tramadol Hcl 50 Mg Tabs (Tramadol hcl) .Marland Kitchen.. 1 tab by mouth two times a day as needed pain  Patient Instructions: 1)  03/05/09:  [facility]-- to come 10 minutes early and bring orange  card 2)  Follow up with Dr. Delrae Alfred in 1 month --depression 3)  Referral to [facility] for counseling Prescriptions: TRAMADOL HCL 50 MG TABS (TRAMADOL HCL) 1 tab by mouth two times a day as needed pain  #60 x 0   Entered and Authorized by:   Julieanne Manson MD   Signed by:   Julieanne Manson MD on 02/05/2010   Method used:   Printed then faxed to ...       [facility] - [facility] (retail)       [address] [place], [place]  [address]       Ph: [phone]       Fax: [phone]   RxID:   [id number] CITALOPRAM HYDROBROMIDE 10 MG TABS (CITALOPRAM HYDROBROMIDE) 1 tab by mouth daily  #30 x 2   Entered and Authorized by:   Julieanne Manson MD   Signed by:   Julieanne Manson MD on 02/05/2010   Method used:   Printed then faxed to ...       [facility] - Pharmac (retail)       [address] [place], [place]  [address]       Ph: [phone]       Fax: [phone]   RxID:   [id number]    Preventive Care Screening  Prior Values:    Pap Smear:  Done (12/20/2005)    Last Tetanus Booster:  Historical (12/20/2001)     Mammogram:  "a while"  never abnormal SBE:  No LMP:  Last week, cannot remember which day started. Guaiac Cards:  Cannot recall, but thinks she has done before. Colonoscopy:  never Immunizations:  has never had a flu immunization--does not want one. Osteoprevention:  2 servings of dairy daily.  No exercise.   Laboratory Results   Urine Tests  Date/Time Received: February 05, 2010 12:48 PM   Routine Urinalysis   Color: lt. yellow Glucose: negative   (Normal Range: Negative) Bilirubin: negative   (Normal Range: Negative) Ketone: negative   (Normal Range: Negative) Spec. Gravity: >=1.030   (Normal Range: 1.003-1.035) Blood: negative   (Normal Range: Negative) pH: 5.0   (Normal Range: 5.0-8.0) Protein: negative   (Normal Range: Negative) Urobilinogen: 0.2   (Normal  Range: 0-1) Nitrite: negative   (Normal Range: Negative) Leukocyte Esterace: negative   (Normal Range: Negative)      Wet Mount Source: vaginal WBC/hpf: 1-5 Bacteria/hpf: 1+ Clue cells/hpf: few  Negative whiff Yeast/hpf: none Wet Mount KOH: Negative Trichomonas/hpf: none  Other Tests  Rapid HIV: negative     Tetanus/Td Immunization History:    Tetanus/Td # 1:  Historical (12/20/2001)  Laboratory Results   Urine Tests    Routine Urinalysis   Color: lt. yellow Glucose: negative   (Normal Range: Negative) Bilirubin: negative   (  Normal Range: Negative) Ketone: negative   (Normal Range: Negative) Spec. Gravity: >=1.030   (Normal Range: 1.003-1.035) Blood: negative   (Normal Range: Negative) pH: 5.0   (Normal Range: 5.0-8.0) Protein: negative   (Normal Range: Negative) Urobilinogen: 0.2   (Normal Range: 0-1) Nitrite: negative   (Normal Range: Negative) Leukocyte Esterace: negative   (Normal Range: Negative)      Wet Mount/KOH  Negative whiff  Other Tests  Rapid HIV: negative

## 2011-01-19 NOTE — Assessment & Plan Note (Signed)
Summary: PPD READING///KT  Nurse Visit   Allergies: No Known Drug Allergies  PPD Results    Date of reading: 02/25/2010    Results: < 5mm    Interpretation: negative  Orders Added: 1)  Est. Patient Nurse visit [09003]

## 2011-01-19 NOTE — Assessment & Plan Note (Signed)
Summary: 1 month fu on depression///kt   Vital Signs:  Patient profile:   45 year old female Weight:      253.06 pounds BMI:     48.78 Temp:     98.2 degrees F Pulse rate:   80 / minute Pulse rhythm:   regular Resp:     20 per minute BP sitting:   131 / 87  (left arm) Cuff size:   large  Vitals Entered By: Chauncy Passy, SMA CC: Pt. is here for a 272month f/u for depression. Pt. states that she is feeling better. Pt. also states she has a cough 1x week w/a burning sensation in her chest.  Is Patient Diabetic? No Pain Assessment Patient in pain? no        CC:  Pt. is here for a 272month f/u for depression. Pt. states that she is feeling better. Pt. also states she has a cough 1x week w/a burning sensation in her chest. .  History of Present Illness: 1.  Depression:  Started on Citalopram only 1 week ago--was delayed getting over to pharmacy to pick up. Feels a bit better--more relaxed.  Sleeping fine.  Feeling a bit better about looking forward to day.  No suicidal or homicidal thoughts.    Current Medications (verified): 1)  Avalide 150-12.5 Mg  Tabs (Irbesartan-Hydrochlorothiazide) .Marland Kitchen.. 1tab By Mouth Once Daily 2)  Protonix 40 Mg  Tbec (Pantoprazole Sodium) .Marland Kitchen.. 1 Tab By Mouth Two Times A Day For 2 Weeks, Then Once Daily 3)  Adult Aspirin Ec Low Strength 81 Mg  Tbec (Aspirin) .Marland Kitchen.. 1 Tab By Mouth Daily With Food 4)  Ferrous Sulfate 325 (65 Fe) Mg  Tbec (Ferrous Sulfate) .Marland Kitchen.. 1 Tab By Mouth Daily 5)  One-A-Day Weight Smart Advance  Tabs (Multiple Vitamins-Minerals) 6)  Citalopram Hydrobromide 10 Mg Tabs (Citalopram Hydrobromide) .Marland Kitchen.. 1 Tab By Mouth Daily 7)  Tramadol Hcl 50 Mg Tabs (Tramadol Hcl) .Marland Kitchen.. 1 Tab By Mouth Two Times A Day As Needed Pain  Allergies (verified): No Known Drug Allergies  Physical Exam  General:  Pt. alert, NAD, smiling and making eye contact.   Impression & Recommendations:  Problem # 1:  ANXIETY DEPRESSION (ICD-300.4) Follow up today not  particularly helpful as she has not been on the med very long. Missed appt. with Newman Nip not set up another appt. yet. Forgot that she had an appt. at [facility] today as well, though gave her that info at her CPP one month ago.  Complete Medication List: 1)  Avalide 150-12.5 Mg Tabs (Irbesartan-hydrochlorothiazide) .Marland Kitchen.. 1tab by mouth once daily 2)  Protonix 40 Mg Tbec (Pantoprazole sodium) .Marland Kitchen.. 1 tab by mouth two times a day for 2 weeks, then once daily 3)  Adult Aspirin Ec Low Strength 81 Mg Tbec (Aspirin) .Marland Kitchen.. 1 tab by mouth daily with food 4)  Ferrous Sulfate 325 (65 Fe) Mg Tbec (Ferrous sulfate) .Marland Kitchen.. 1 tab by mouth daily 5)  One-a-day Weight Smart Advance Tabs (Multiple vitamins-minerals) 6)  Citalopram Hydrobromide 10 Mg Tabs (Citalopram hydrobromide) .Marland Kitchen.. 1 tab by mouth daily 7)  Tramadol Hcl 50 Mg Tabs (Tramadol hcl) .Marland Kitchen.. 1 tab by mouth two times a day as needed pain  Patient Instructions: 1)  Follow up with Dr. Delrae Alfred in 6 weeks--depression. 2)  Remember you have an appt. today at [facility] at 3:30 p.m. today--bring your orange card

## 2011-01-19 NOTE — Progress Notes (Signed)
  Phone Note From Other Clinic   Summary of Call: Spoke with [facility] Sol[facility], pt. nauseated with Citalopram.  Will switch to Zoloft and see if better tolerated. Tiffany--please call pharmacy and let them know to cancel the Citalopram--I forgot to note on the fax. Cancel discontinuation with pharmacy--will need to cancel Zoloft--please call pharmacy and cancel Zoloft Rx Initial call taken by: Julieanne Manson MD,  March 26, 2010 11:43 AM  Follow-up for Phone Call        Zoloft cancelled at [facility]. Follow-up by: Vesta Mixer CMA,  March 30, 2010 10:58 AM   New Allergies: ! CITALOPRAM HYDROBROMIDE (CITALOPRAM HYDROBROMIDE) New/Updated Medications: ZOLOFT 50 MG TABS (SERTRALINE HCL) 1/2 tab by mouth daily for 7 days, then 1 tab by mouth daily CITALOPRAM HYDROBROMIDE 10 MG TABS (CITALOPRAM HYDROBROMIDE) 1 tab by mouth daily New Allergies: ! CITALOPRAM HYDROBROMIDE (CITALOPRAM HYDROBROMIDE)Prescriptions: ZOLOFT 50 MG TABS (SERTRALINE HCL) 1/2 tab by mouth daily for 7 days, then 1 tab by mouth daily  #30 x 1   Entered and Authorized by:   Julieanne Manson MD   Signed by:   Julieanne Manson MD on 03/26/2010   Method used:   Faxed to ...       [facility] - [facility] (retail)       [address] [place], [place]  [address]       Ph: [phone]       Fax: [phone]   RxID:   [id number]

## 2011-01-19 NOTE — Letter (Signed)
Summary: AMANDA'S SUMMARY  AMANDA'S SUMMARY   Imported By: Arta Bruce 07/10/2010 09:18:07  _____________________________________________________________________  External Attachment:    Type:   Image     Comment:   External Document

## 2011-01-19 NOTE — Assessment & Plan Note (Signed)
Summary: 2ND PPD////RJP  Nurse Visit   Allergies: No Known Drug Allergies Laboratory Results     Immunizations Administered:  PPD Skin Test:    Vaccine Type: PPD    Site: right forearm    Mfr: Sanofi Pasteur    Dose: 0.1 ml    Route: ID    Given by: Levon Hedger    Exp. Date: 01/17/2012    Lot #: [id number]  Orders Added: 1)  TB Skin Test [86580] 2)  Admin 1st Vaccine [90471] 3)  Est. Patient Nurse visit [09003]

## 2011-01-19 NOTE — Assessment & Plan Note (Signed)
Summary: FU ON DEPRESSION//KT   Vital Signs:  Patient profile:   45 year old female Weight:      260 pounds Temp:     97.9 degrees F Pulse rate:   91 / minute Pulse rhythm:   regular Resp:     20 per minute BP sitting:   147 / 83  (left arm) Cuff size:   large  Vitals Entered By: Vesta Mixer CMA (May 28, 2010 3:50 PM) CC: f/u depression, feels like she is doing a little better.  Not taking the celexa due to nausea. Is Patient Diabetic? No Pain Assessment Patient in pain? no       Does patient need assistance? Ambulation Normal   CC:  f/u depression and feels like she is doing a little better.  Not taking the celexa due to nausea..  History of Present Illness: 1.  Depression:  pt. stopped the Citalopram as was nauseated on it--was taking on an empty stomach and not eating until hours later.  Did try taking for about 2 weeks subsequent to that at a meal, but still with nausea.  We did not ever actually switch her to another medication as the history of her taking without a meal was shared with Marchelle Folks after we talked about switching to Zoloft and pt. agreed to try taking with a meal.    Later, pt. states she is still nauseated and dizzy--actually worse than when she was on the Citalopram.    Still often does not care about things.  Has to force herself to get out of bed.  Fatigued much of the time.   2.  Nausea:  Mainly in morning or anytime when awakens.  Often associated with vertiginous symptoms.  Sounds like dizziness occurs when stands up from lying or sitting down--cannot say if with turning head one way or another.  Feels like something is draining in head when stands up after having head down.  May take 15 minutes before goes away.  Does not really feel congested.  Not much in way of allergies.  Occasional sneeze.   Dizziness has been going on for about 1 month.   Allergies (verified): No Known Drug Allergies  Physical Exam  General:  Morbidly obese, appears  fatigued. Eyes:  pupils round and pupils reactive to light.  EOMI without nystagmus Ears:  External ear exam shows no significant lesions or deformities.  Otoscopic examination reveals clear canals, tympanic membranes are intact bilaterally without bulging, retraction, inflammation or discharge. Hearing is grossly normal bilaterally. Nose:  Mucosal swelling and bogginess.  Clear to green discharge. Mouth:  pharynx pink and moist.   Lungs:  Normal respiratory effort, chest expands symmetrically. Lungs are clear to auscultation, no crackles or wheezes. Heart:  RRR, a bit tachycardic Neurologic:  Lucious Groves maneuver performed.  Definite symptoms with head turned to right, no associated nystagmusgait normal, finger-to-nose normal, and Romberg negative.     Impression & Recommendations:  Problem # 1:  ANXIETY DEPRESSION (ICD-300.4) To restart Citalopram  Problem # 2:  SINUSITIS, ACUTE (ICD-461.9) With possible allergies as well Her updated medication list for this problem includes:    Azithromycin 250 Mg Tabs (Azithromycin) .Marland Kitchen... 2 tabs by mouth daily for 6 days    Fluticasone Propionate 50 Mcg/act Susp (Fluticasone propionate) .Marland Kitchen... 2 sprays each nostril daily  Complete Medication List: 1)  Avalide 150-12.5 Mg Tabs (Irbesartan-hydrochlorothiazide) .Marland Kitchen.. 1tab by mouth once daily 2)  Protonix 40 Mg Tbec (Pantoprazole sodium) .Marland Kitchen.. 1 tab by  mouth two times a day for 2 weeks, then once daily 3)  Adult Aspirin Ec Low Strength 81 Mg Tbec (Aspirin) .Marland Kitchen.. 1 tab by mouth daily with food 4)  Ferrous Sulfate 325 (65 Fe) Mg Tbec (Ferrous sulfate) .Marland Kitchen.. 1 tab by mouth daily 5)  One-a-day Weight Smart Advance Tabs (Multiple vitamins-minerals) 6)  Tramadol Hcl 50 Mg Tabs (Tramadol hcl) .Marland Kitchen.. 1 tab by mouth two times a day as needed pain 7)  Citalopram Hydrobromide 10 Mg Tabs (Citalopram hydrobromide) .Marland Kitchen.. 1 tab by mouth daily 8)  Azithromycin 250 Mg Tabs (Azithromycin) .... 2 tabs by mouth daily for 6 days 9)   Fluticasone Propionate 50 Mcg/act Susp (Fluticasone propionate) .... 2 sprays each nostril daily 10)  Xyzal 5 Mg Tabs (Levocetirizine dihydrochloride) .Marland Kitchen.. 1 tab by mouth daily  Patient Instructions: 1)  Follow up with Dr. Delrae Alfred in 1 month --depression 2)  Reappoint with [facility] Sol[facility] 3)  Call if you get more nauseated on Citalopram Prescriptions: CITALOPRAM HYDROBROMIDE 10 MG TABS (CITALOPRAM HYDROBROMIDE) 1 tab by mouth daily  #30 x 2   Entered and Authorized by:   Julieanne Manson MD   Signed by:   Julieanne Manson MD on 05/28/2010   Method used:   Faxed to ...       [facility] - [facility] (retail)       [address] [place], [place]  [address]       Ph: [phone]       Fax: [phone]   RxID:   [id number] XYZAL 5 MG TABS (LEVOCETIRIZINE DIHYDROCHLORIDE) 1 tab by mouth daily  #30 x 6   Entered and Authorized by:   Julieanne Manson MD   Signed by:   Julieanne Manson MD on 05/28/2010   Method used:   Faxed to ...       [facility] - [facility] (retail)       [address] [place], [place]  [address]       Ph: [phone]       Fax: [phone]   RxID:   [id number] FLUTICASONE PROPIONATE 50 MCG/ACT SUSP (FLUTICASONE PROPIONATE) 2 sprays each nostril daily  #1 x 6   Entered and Authorized by:   Julieanne Manson MD   Signed by:   Julieanne Manson MD on 05/28/2010   Method used:   Faxed to ...       [facility] - Pharmac (retail)       [address] [place], [place]  [address]       Ph: [phone]       Fax: [phone]   RxID:   [id number] AZITHROMYCIN 250 MG TABS (AZITHROMYCIN) 2 tabs by mouth daily for 6 days  #12 x 0   Entered and Authorized by:   Julieanne Manson MD   Signed by:   Julieanne Manson MD on 05/28/2010   Method used:   Faxed to ...       [facility] - Pharmac (retail)       [address] [place], [place]  [address]       Ph: [phone]       Fax: [phone]   RxID:   [id number]   Appended Document: FU ON DEPRESSION//KT   Vital Signs:  Patient profile:   45 year old female Pulse (ortho):  96 / minute BP standing:   156 / 90   Serial Vital Signs/Assessments:  Time      Position  BP       Pulse  Resp  Temp     By           Lying LA  132/80   88                    Tiffany McCoy CMA           Standing  156/90   96                    Tiffany McCoy CMA

## 2011-01-21 NOTE — Letter (Signed)
Summary: GYNECOLOGIC CYTOLOGY REPORT  GYNECOLOGIC CYTOLOGY REPORT   Imported By: Arta Bruce 01/11/2011 14:31:20  _____________________________________________________________________  External Attachment:    Type:   Image     Comment:   External Document

## 2011-05-04 NOTE — Procedures (Signed)
[name], Whitney Boyer              ACCOUNT NO.:  [id number]   MEDICAL RECORD NO.:  [id number]          PATIENT TYPE:  OUT   LOCATION:  SLEEP CENTER                 FACILITY:  [facility]   PHYSICIAN:  Clinton D. Maple Hudson, MD, FCCP, FACPDATE OF BIRTH:  February 26, 1965   DATE OF STUDY:  01/05/2008                            NOCTURNAL POLYSOMNOGRAM   REFERRING PHYSICIAN:  Marcene Duos, M.D.   INDICATION FOR STUDY:  Hypersomnia with sleep apnea, hypertension.   EPWORTH SLEEPINESS SCORE:  20/24, BMI 45.3, weight 240 pounds, height 61  inches, neck 15.5 inches.   MEDICATIONS:  Charted and reviewed.   SLEEP ARCHITECTURE:  Total sleep time 370 minutes with sleep efficiency  90.6%.  Stage I was 4.6%, stage II 70.1%, stage III absent, REM 25.3% of  total sleep time.  Sleep latency 13 minutes.  REM latency 77 minutes.  Awake after sleep onset 25 minutes.  Arousal index 8.9.  No bedtime  medication was taken.   RESPIRATORY DATA:  Apnea/hypopnea index (AHI) 1.3 events per hours which  is within normal limits.  There were 8 scored events including 1  obstructive apnea and 7 hypopneas.  Most events occurred while sleeping  supine.   OXYGEN DATA:  Loud snoring with oxygen desaturation to a nadir of 90%.  Mean oxygen saturation through the study was 98% on room air.   CARDIAC DATA:  Sinus rhythm with PAC and occasional PVCs.   MOVEMENT-PARASOMNIA:  123 events scored as periodic limb movements for  an index of 19.9 per hour.   IMPRESSIONS-RECOMMENDATIONS:  1. Unremarkable sleep architecture for sleep center environment on      this study night.  2. Occasional respiratory events within normal limits with      apnea/hypopnea index 1.3 per hour (normal range 0-5 per hour).      Most occurred while sleeping supine suggesting she might be      encouraged to sleep off the flat of her back.  3. Loud snoring with oxygen desaturation to a nadir of 90%.  4. Periodic limb movement with arousal syndrome,  19.9 per hour      suggesting the patient might benefit from a therapeutic trial such      as Requip or Mirapex if clinically appropriate.      Clinton D. Maple Hudson, MD, [facility], FACP  Diplomate, Biomedical engineer of Sleep Medicine  Electronically Signed     CDY/MEDQ  D:  01/13/2008 17:01:37  T:  01/14/2008 12:18:53  Job:  [id number]

## 2011-05-04 NOTE — Group Therapy Note (Signed)
NAME:  [name], [name] NO.:  [id number]   MEDICAL RECORD NO.:  [id number]          PATIENT TYPE:  WOC   LOCATION:  [facility]                   FACILITY:  [facility]   PHYSICIAN:  Allie Bossier, MD        DATE OF BIRTH:  Nov 30, 1966   DATE OF SERVICE:  06/12/2009                                  CLINIC NOTE   Ms. Whitney Boyer is a 45 year old, single, black gravida 3, para 3 who is here  as a referral from the [facility].  She was evaluated in  December of 2009 for heavy irregular vaginal bleeding.  Ms. Whitney Boyer  describes her periods as lasting 14 days and very heavy with clots at  that time.  An ultrasound was obtained there that showed multiple  fibroids and an endometrium that was listed as 2.2 cm.  They used the  phrase persistently thickened and said that it had been measured at 2 cm  on an ultrasound in 2004.  Please note that her fibroids were measured  in 2004 as well and they have doubled in size, previously 3 cm in  maximum diameter now 6 cm.  At that time, she was treated with Depot  Lupron which helped her in that time frame but not in late 2009.  In the  last several months, her bleeding is somewhat better but not completely  normal.  I did an endometrial biopsy after cleaning the cervix with  Betadine and using a single-tooth tenaculum on the anterior lip I used  the Pipelle.  The uterus sounded to 10 cm with the Pipelle.  I obtained  large amounts of endometrial tissue.  She tolerated the procedure well.  She will come back in 2 weeks for the results and treatment as  necessary.      Allie Bossier, MD     MCD/MEDQ  D:  06/12/2009  T:  06/12/2009  Job:  [id number]
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
## 2011-08-18 ENCOUNTER — Emergency Department (HOSPITAL_COMMUNITY)
Admission: EM | Admit: 2011-08-18 | Discharge: 2011-08-18 | Disposition: A | Discharge status: Home / Self Care | Payer: Self-pay | Attending: Emergency Medicine | Admitting: Emergency Medicine

## 2011-08-18 DIAGNOSIS — L989 Disorder of the skin and subcutaneous tissue, unspecified: Secondary | ICD-10-CM | POA: Insufficient documentation

## 2011-08-18 DIAGNOSIS — R51 Headache: Secondary | ICD-10-CM | POA: Insufficient documentation

## 2011-08-18 DIAGNOSIS — I10 Essential (primary) hypertension: Secondary | ICD-10-CM | POA: Insufficient documentation

## 2011-09-23 LAB — DIFFERENTIAL
Basophils Absolute: 0.1 10*3/uL (ref 0.0–0.1)
Basophils Relative: 1 % (ref 0–1)
Eosinophils Relative: 3 % (ref 0–5)
Monocytes Absolute: 0.7 10*3/uL (ref 0.1–1.0)
Monocytes Relative: 8 % (ref 3–12)

## 2011-09-23 LAB — CBC
MCHC: 32.9 g/dL (ref 30.0–36.0)
MCV: 84.7 fL (ref 78.0–100.0)

## 2011-11-25 ENCOUNTER — Emergency Department (HOSPITAL_COMMUNITY)
Admission: EM | Admit: 2011-11-25 | Discharge: 2011-11-25 | Disposition: A | Discharge status: Home / Self Care | Payer: Self-pay | Attending: Emergency Medicine | Admitting: Emergency Medicine

## 2011-11-25 ENCOUNTER — Other Ambulatory Visit: Payer: Self-pay

## 2011-11-25 DIAGNOSIS — I10 Essential (primary) hypertension: Secondary | ICD-10-CM | POA: Insufficient documentation

## 2011-11-25 DIAGNOSIS — J111 Influenza due to unidentified influenza virus with other respiratory manifestations: Secondary | ICD-10-CM | POA: Insufficient documentation

## 2011-11-25 HISTORY — DX: Unspecified osteoarthritis, unspecified site: M19.90

## 2011-11-25 HISTORY — DX: Essential (primary) hypertension: I10

## 2011-11-25 MED ORDER — OXYCODONE-ACETAMINOPHEN 5-325 MG PO TABS: 2.0000 | ORAL_TABLET | ORAL | Status: AC | PRN

## 2011-11-25 MED ORDER — IBUPROFEN 800 MG PO TABS: 800.0000 mg | ORAL_TABLET | Freq: Three times a day (TID) | ORAL | Status: AC

## 2011-11-25 NOTE — ED Provider Notes (Signed)
History     CSN: [id number] Arrival date & time: 11/25/2011  2:53 PM   First MD Initiated Contact with Patient 11/25/11 1805      Chief Complaint  Patient presents with  . Headache  . Chest Pain  . Sore Throat  . Cough   patient presents with flu symptoms since Monday. She is taking over-the-counter medications without any significant relief. She mainly has diffuse body aches, headache, sore throat, cough. [name] had no vomiting, no back pain, no rash. She did not have a flu shot this year  (Consider location/radiation/quality/duration/timing/severity/associated sxs/prior treatment) HPI  Past Medical History  Diagnosis Date  . Arthritis   . Asthma   . Hypertension     Past Surgical History  Procedure Date  . Fracture surgery   . Tubal ligation     No family history on file.  History  Substance Use Topics  . Smoking status: Never Smoker   . Smokeless tobacco: Not on file  . Alcohol Use: No    OB History    Grav Para Term Preterm Abortions TAB SAB Ect Mult Living                  Review of Systems  All other systems reviewed and are negative.    Allergies  Review of patient's allergies indicates no known allergies.  Home Medications   Current Outpatient Rx  Name Route Sig Dispense Refill  . ALBUTEROL SULFATE HFA 108 (90 BASE) MCG/ACT IN AERS Inhalation Inhale 2 puffs into the lungs every 6 (six) hours as needed.      . ASPIRIN 81 MG PO CHEW Oral Chew 81 mg by mouth daily.      Marland Kitchen CITALOPRAM HYDROBROMIDE 20 MG PO TABS Oral Take 20 mg by mouth as needed.      Marland Kitchen DOCUSATE SODIUM 100 MG PO CAPS Oral Take 100 mg by mouth 2 (two) times daily.      Marland Kitchen FERROUS SULFATE 325 (65 FE) MG PO TABS Oral Take 325 mg by mouth daily with breakfast.      . IRBESARTAN 75 MG PO TABS Oral Take 75 mg by mouth at bedtime.      . MULTIVITAMINS PO TABS Oral Take 1 tablet by mouth daily.      . TRAMADOL HCL 50 MG PO TABS Oral Take 50 mg by mouth every 6 (six) hours as needed. Maximum  dose= 8 tablets per day       BP 119/78  Pulse 97  Temp(Src) 99 F (37.2 C) (Oral)  Resp 20  SpO2 100%  LMP 11/04/2011  Physical Exam  Nursing note and vitals reviewed. Constitutional: She is oriented to person, place, and time. She appears well-developed and well-nourished.  HENT:  Head: Normocephalic and atraumatic.  Mouth/Throat: Oropharynx is clear and moist.       No swelling, exudate, or abscess  Eyes: Conjunctivae and EOM are normal. Pupils are equal, round, and reactive to light.  Neck: Neck supple.  Cardiovascular: Normal rate and regular rhythm.  Exam reveals no gallop and no friction rub.   No murmur heard. Pulmonary/Chest: Breath sounds normal. She has no wheezes. She has no rales. She exhibits no tenderness.  Abdominal: Soft. Bowel sounds are normal. She exhibits no distension. There is no tenderness. There is no rebound and no guarding.  Musculoskeletal: Normal range of motion.  Neurological: She is alert and oriented to person, place, and time. No cranial nerve deficit. Coordination normal.  Skin: Skin is  warm and dry. No rash noted.  Psychiatric: She has a normal mood and affect.    ED Course  Procedures (including critical care time)  Labs Reviewed - No data to display No results found.   No diagnosis found.    MDM  Pt is seen and examined;  Initial history and physical completed.  Will follow.          Kass Herberger A. Patrica Duel, MD 11/25/11 1610

## 2011-11-25 NOTE — ED Notes (Signed)
Pt. States "cold symptoms since Monday"  Taken several meds with no relief.  Began having pins/needles sensation to left chest this morning, SOB only related to cough.

## 2012-03-29 ENCOUNTER — Emergency Department (HOSPITAL_COMMUNITY)
Admission: EM | Admit: 2012-03-29 | Discharge: 2012-03-29 | Disposition: A | Discharge status: Home / Self Care | Payer: Self-pay | Attending: Emergency Medicine | Admitting: Emergency Medicine

## 2012-03-29 ENCOUNTER — Encounter (HOSPITAL_COMMUNITY): Payer: Self-pay | Admitting: *Deleted

## 2012-03-29 DIAGNOSIS — M25559 Pain in unspecified hip: Secondary | ICD-10-CM

## 2012-03-29 DIAGNOSIS — M1610 Unilateral primary osteoarthritis, unspecified hip: Secondary | ICD-10-CM | POA: Insufficient documentation

## 2012-03-29 DIAGNOSIS — I10 Essential (primary) hypertension: Secondary | ICD-10-CM | POA: Insufficient documentation

## 2012-03-29 DIAGNOSIS — M169 Osteoarthritis of hip, unspecified: Secondary | ICD-10-CM | POA: Insufficient documentation

## 2012-03-29 DIAGNOSIS — J45909 Unspecified asthma, uncomplicated: Secondary | ICD-10-CM | POA: Insufficient documentation

## 2012-03-29 MED ORDER — PREDNISONE 10 MG PO TABS: 20.0000 mg | ORAL_TABLET | Freq: Every day | ORAL | Status: DC

## 2012-03-29 MED ORDER — OXYCODONE-ACETAMINOPHEN 5-325 MG PO TABS: 2.0000 | ORAL_TABLET | ORAL | Status: AC | PRN

## 2012-03-29 NOTE — ED Notes (Signed)
Reports having right hip pain for extended amount of time, has become more severe and radiating down right leg. Denies injury to hip. Ambulatory at triage.

## 2012-03-29 NOTE — ED Provider Notes (Signed)
History     CSN: [id number]  Arrival date & time 03/29/12  1358   First MD Initiated Contact with Patient 03/29/12 1500      Chief Complaint  Patient presents with  . Hip Pain    (Consider location/radiation/quality/duration/timing/severity/associated sxs/prior treatment) Patient is a 46 y.o. female presenting with hip pain. The history is provided by the patient.  Hip Pain   patient here with worsening of her chronic right hip pain. 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 No trauma. No change in bowel or bladder function. Pain is dull and worse with standing better with rest. Has been using Ultram without relief.  Past Medical History  Diagnosis Date  . Arthritis   . Asthma   . Hypertension     Past Surgical History  Procedure Date  . Fracture surgery   . Tubal ligation     History reviewed. No pertinent family history.  History  Substance Use Topics  . Smoking status: Never Smoker   . Smokeless tobacco: Not on file  . Alcohol Use: No    OB History    Grav Para Term Preterm Abortions TAB SAB Ect Mult Living                  Review of Systems  All other systems reviewed and are negative.    Allergies  Review of patient's allergies indicates no known allergies.  Home Medications   Current Outpatient Rx  Name Route Sig Dispense Refill  . ALBUTEROL SULFATE HFA 108 (90 BASE) MCG/ACT IN AERS Inhalation Inhale 2 puffs into the lungs every 6 (six) hours as needed. For shortness of breath/wheezing    . ASPIRIN 81 MG PO CHEW Oral Chew 81 mg by mouth daily.     . CHLORTHALIDONE 25 MG PO TABS Oral Take 25 mg by mouth daily.    Marland Kitchen CITALOPRAM HYDROBROMIDE 20 MG PO TABS Oral Take 20 mg by mouth daily.     Marland Kitchen DOCUSATE SODIUM 100 MG PO CAPS Oral Take 100 mg by mouth daily.     Marland Kitchen FERROUS SULFATE 325 (65 FE) MG PO TABS Oral Take 325 mg by mouth daily with breakfast.     . IRBESARTAN 150  MG PO TABS Oral Take 150 mg by mouth daily.    . MULTIVITAMINS PO TABS Oral Take 1 tablet by mouth daily.      Marland Kitchen OMEPRAZOLE 20 MG PO CPDR Oral Take 20 mg by mouth daily.    . TRAMADOL HCL 50 MG PO TABS Oral Take 50 mg by mouth at bedtime. Maximum dose= 8 tablets per day      BP 133/79  Pulse 77  Temp 98.3 F (36.8 C)  Resp 14  SpO2 98%  LMP 11/04/2011  Physical Exam  Nursing note and vitals reviewed. Constitutional: She is oriented to person, place, and time. She appears well-developed and well-nourished.  Non-toxic appearance. No distress.  HENT:  Head: Normocephalic and atraumatic.  Eyes: Conjunctivae, EOM and lids are normal. Pupils are equal, round, and reactive to light.  Neck: Normal range of motion. Neck supple. No tracheal deviation present. No mass present.  Cardiovascular: Normal rate, regular rhythm and normal heart sounds.  Exam reveals no gallop.   No murmur heard. Pulmonary/Chest: Effort normal and breath sounds normal. No stridor. No respiratory distress. She has no decreased breath sounds. She has no wheezes. She has no  rhonchi. She has no rales.  Abdominal: Soft. Normal appearance and bowel sounds are normal. She exhibits no distension. There is no tenderness. There is no rebound and no CVA tenderness.  Musculoskeletal: Normal range of motion. She exhibits no edema and no tenderness.       Right hip with full range of motion. No shortening or rotation. Reflexes intact. Neurovascular status stable  Neurological: She is alert and oriented to person, place, and time. She has normal strength. No cranial nerve deficit or sensory deficit. GCS eye subscore is 4. GCS verbal subscore is 5. GCS motor subscore is 6.  Skin: Skin is warm and dry. No abrasion and no rash noted.  Psychiatric: She has a normal mood and affect. Her speech is normal and behavior is normal.    ED Course  Procedures (including critical care time)  Labs Reviewed - No data to display No results  found.   No diagnosis found.    MDM  Patient to be treated for osteoarthritis of her hip no concern for septic joint        Toy Baker, MD 03/29/12 [phone]

## 2012-03-29 NOTE — ED Notes (Signed)
Patient states onset few weeks ago denies any trauma pain bilateral hips radiating to bilateral lower extremities.  Pain worsening overtime 7-10/10.  Full sensation pedal pulses +2. Denies bowel or urinary complaints.

## 2012-03-29 NOTE — Discharge Instructions (Signed)
Osteoarthritis Osteoarthritis is the most common form of arthritis. It is redness, soreness, and swelling (inflammation) affecting the cartilage. Cartilage acts as a cushion, covering the ends of bones where they meet to form a joint. CAUSES  Over time, the cartilage begins to wear away. This causes bone to rub on bone. This produces pain and stiffness in the affected joints. Factors that contribute to this problem are:  Excessive body weight.   Age.   Overuse of joints.  SYMPTOMS   People with osteoarthritis usually experience joint pain, swelling, or stiffness.   Over time, the joint may lose its normal shape.   Small deposits of bone (osteophytes) may grow on the edges of the joint.   Bits of bone or cartilage can break off and float inside the joint space. This may cause more pain and damage.   Osteoarthritis can lead to depression, anxiety, feelings of helplessness, and limitations on daily activities.  The most commonly affected joints are in the:  Ends of the fingers.   Thumbs.   Neck.   Lower back.   Knees.   Hips.  DIAGNOSIS  Diagnosis is mostly based on your symptoms and exam. Tests may be helpful, including:  X-rays of the affected joint.   A computerized magnetic scan (MRI).   Blood tests to rule out other types of arthritis.   Joint fluid tests. This involves using a needle to draw fluid from the joint and examining the fluid under a microscope.  TREATMENT  Goals of treatment are to control pain, improve joint function, maintain a normal body weight, and maintain a healthy lifestyle. Treatment approaches may include:  A prescribed exercise program with rest and joint relief.   Weight control with nutritional education.   Pain relief techniques such as:   Properly applied heat and cold.   Electric pulses delivered to nerve endings under the skin (transcutaneous electrical nerve stimulation, TENS).   Massage.   Certain supplements. Ask your  caregiver before using any supplements, especially in combination with prescribed drugs.   Medicines to control pain, such as:   Acetaminophen.   Nonsteroidal anti-inflammatory drugs (NSAIDs), such as naproxen.   Narcotic or central-acting agents, such as tramadol. This drug carries a risk of addiction and is generally prescribed for short-term use.   Corticosteroids. These can be given orally or as injection. This is a short-term treatment, not recommended for routine use.   Surgery to reposition the bones and relieve pain (osteotomy) or to remove loose pieces of bone and cartilage. Joint replacement may be needed in advanced states of osteoarthritis.  HOME CARE INSTRUCTIONS  Your caregiver can recommend specific types of exercise. These may include:  Strengthening exercises. These are done to strengthen the muscles that support joints affected by arthritis. They can be performed with weights or with exercise bands to add resistance.   Aerobic activities. These are exercises, such as brisk walking or low-impact aerobics, that get your heart pumping. They can help keep your lungs and circulatory system in shape.   Range-of-motion activities. These keep your joints limber.   Balance and agility exercises. These help you maintain daily living skills.  Learning about your condition and being actively involved in your care will help improve the course of your osteoarthritis. SEEK MEDICAL CARE IF:   You feel hot or your skin turns red.   You develop a rash in addition to your joint pain.   You have an oral temperature above 102 F (38.9 C).  FOR   MORE INFORMATION  National Institute of Arthritis and Musculoskeletal and Skin Diseases: www.niams.nih.gov National Institute on Aging: www.nia.nih.gov American College of Rheumatology: www.rheumatology.org Document Released: 12/06/2005 Document Revised: 11/25/2011 Document Reviewed: 03/19/2010 [employer] Patient Information 2012 [employer]. 

## 2012-10-20 DIAGNOSIS — I219 Acute myocardial infarction, unspecified: Secondary | ICD-10-CM

## 2012-10-20 HISTORY — DX: Acute myocardial infarction, unspecified: I21.9

## 2012-10-30 LAB — COMPREHENSIVE METABOLIC PANEL
Albumin: 3.3 g/dL — ABNORMAL LOW (ref 3.4–5.0)
Alkaline Phosphatase: 114 U/L (ref 50–136)
Anion Gap: 6 — ABNORMAL LOW (ref 7–16)
BUN: 14 mg/dL (ref 7–18)
Bilirubin,Total: 0.4 mg/dL (ref 0.2–1.0)
Creatinine: 0.72 mg/dL (ref 0.60–1.30)
Osmolality: 279 (ref 275–301)
Total Protein: 7.8 g/dL (ref 6.4–8.2)

## 2012-10-30 LAB — CBC WITH DIFFERENTIAL/PLATELET
Basophil #: 0.1 10*3/uL (ref 0.0–0.1)
Basophil %: 1 %
Eosinophil #: 0.2 10*3/uL (ref 0.0–0.7)
HCT: 38 % (ref 35.0–47.0)
MCH: 28.5 pg (ref 26.0–34.0)
MCV: 84 fL (ref 80–100)
Monocyte #: 0.9 x10 3/mm (ref 0.2–0.9)
Neutrophil #: 5.8 10*3/uL (ref 1.4–6.5)
Platelet: 341 10*3/uL (ref 150–440)
RBC: 4.54 10*6/uL (ref 3.80–5.20)
RDW: 14.8 % — ABNORMAL HIGH (ref 11.5–14.5)
WBC: 9.7 10*3/uL (ref 3.6–11.0)

## 2012-10-30 LAB — TROPONIN I: Troponin-I: 0.07 ng/mL — ABNORMAL HIGH

## 2012-10-31 ENCOUNTER — Inpatient Hospital Stay: Payer: Self-pay | Admitting: Internal Medicine

## 2012-10-31 LAB — CK
CK, Total: 114 U/L (ref 21–215)
CK, Total: 104 U/L (ref 21–215)

## 2012-10-31 LAB — TROPONIN I
Troponin-I: 0.07 ng/mL — ABNORMAL HIGH
Troponin-I: 0.07 ng/mL — ABNORMAL HIGH

## 2012-10-31 LAB — CK-MB
CK-MB: 1.2 ng/mL (ref 0.5–3.6)
CK-MB: 0.9 ng/mL (ref 0.5–3.6)

## 2012-11-01 DIAGNOSIS — R0602 Shortness of breath: Secondary | ICD-10-CM

## 2012-11-01 LAB — CBC WITH DIFFERENTIAL/PLATELET
Eosinophil #: 0 10*3/uL (ref 0.0–0.7)
Eosinophil %: 0.1 %
MCV: 84 fL (ref 80–100)
Monocyte %: 8.8 %
Neutrophil %: 77.4 %
Platelet: 342 10*3/uL (ref 150–440)
WBC: 11.7 10*3/uL — ABNORMAL HIGH (ref 3.6–11.0)

## 2012-11-01 LAB — COMPREHENSIVE METABOLIC PANEL
Alkaline Phosphatase: 107 U/L (ref 50–136)
Anion Gap: 11 (ref 7–16)
Bilirubin,Total: 0.4 mg/dL (ref 0.2–1.0)
Calcium, Total: 8.5 mg/dL (ref 8.5–10.1)
Chloride: 103 mmol/L (ref 98–107)
Co2: 25 mmol/L (ref 21–32)
EGFR (African American): >60
EGFR (Non-African Amer.): >60
Glucose: 120 mg/dL — ABNORMAL HIGH (ref 65–99)
Osmolality: 278 (ref 275–301)
SGOT(AST): 14 U/L — ABNORMAL LOW (ref 15–37)
Sodium: 139 mmol/L (ref 136–145)

## 2012-11-01 LAB — LIPID PANEL
Cholesterol: 134 mg/dL (ref 0–200)
HDL Cholesterol: 44 mg/dL (ref 40–60)
Triglycerides: 93 mg/dL (ref 0–200)

## 2013-12-10 ENCOUNTER — Encounter (HOSPITAL_COMMUNITY): Payer: Self-pay | Admitting: Emergency Medicine

## 2013-12-10 DIAGNOSIS — K273 Acute peptic ulcer, site unspecified, without hemorrhage or perforation: Secondary | ICD-10-CM | POA: Insufficient documentation

## 2013-12-10 DIAGNOSIS — M129 Arthropathy, unspecified: Secondary | ICD-10-CM | POA: Insufficient documentation

## 2013-12-10 DIAGNOSIS — Z79899 Other long term (current) drug therapy: Secondary | ICD-10-CM | POA: Insufficient documentation

## 2013-12-10 DIAGNOSIS — J45909 Unspecified asthma, uncomplicated: Secondary | ICD-10-CM | POA: Insufficient documentation

## 2013-12-10 DIAGNOSIS — I10 Essential (primary) hypertension: Secondary | ICD-10-CM | POA: Insufficient documentation

## 2013-12-10 DIAGNOSIS — R110 Nausea: Secondary | ICD-10-CM | POA: Insufficient documentation

## 2013-12-10 DIAGNOSIS — Z3202 Encounter for pregnancy test, result negative: Secondary | ICD-10-CM | POA: Insufficient documentation

## 2013-12-10 DIAGNOSIS — Z7982 Long term (current) use of aspirin: Secondary | ICD-10-CM | POA: Insufficient documentation

## 2013-12-10 DIAGNOSIS — R1013 Epigastric pain: Secondary | ICD-10-CM | POA: Insufficient documentation

## 2013-12-10 NOTE — ED Notes (Signed)
Pt. reports low abdominal pain with nausea for 3 weeks , denies emesis or diarrhea , no urinary discomfort , denies fever or chills.

## 2013-12-11 ENCOUNTER — Emergency Department (HOSPITAL_COMMUNITY)
Admission: EM | Admit: 2013-12-11 | Discharge: 2013-12-11 | Disposition: A | Discharge status: Home / Self Care | Payer: Medicaid Other | Attending: Emergency Medicine | Admitting: Emergency Medicine

## 2013-12-11 ENCOUNTER — Encounter (HOSPITAL_COMMUNITY): Payer: Self-pay | Admitting: Emergency Medicine

## 2013-12-11 DIAGNOSIS — R109 Unspecified abdominal pain: Secondary | ICD-10-CM

## 2013-12-11 DIAGNOSIS — K279 Peptic ulcer, site unspecified, unspecified as acute or chronic, without hemorrhage or perforation: Secondary | ICD-10-CM

## 2013-12-11 HISTORY — DX: Obesity, unspecified: E66.9

## 2013-12-11 LAB — CBC WITH DIFFERENTIAL/PLATELET
Eosinophils Relative: 2 % (ref 0–5)
HCT: 39 % (ref 36.0–46.0)
Hemoglobin: 13.1 g/dL (ref 12.0–15.0)
Lymphocytes Relative: 53 % — ABNORMAL HIGH (ref 12–46)
Lymphs Abs: 3.4 10*3/uL (ref 0.7–4.0)
MCV: 83.7 fL (ref 78.0–100.0)
Monocytes Absolute: 0.5 10*3/uL (ref 0.1–1.0)
Monocytes Relative: 8 % (ref 3–12)
Neutro Abs: 2.4 10*3/uL (ref 1.7–7.7)
RBC: 4.66 MIL/uL (ref 3.87–5.11)
RDW: 14.7 % (ref 11.5–15.5)
WBC: 6.5 10*3/uL (ref 4.0–10.5)

## 2013-12-11 LAB — COMPREHENSIVE METABOLIC PANEL
AST: 19 U/L (ref 0–37)
BUN: 13 mg/dL (ref 6–23)
CO2: 27 mEq/L (ref 19–32)
Calcium: 8.7 mg/dL (ref 8.4–10.5)
Chloride: 102 mEq/L (ref 96–112)
Creatinine, Ser: 0.64 mg/dL (ref 0.50–1.10)
GFR calc Af Amer: >90 mL/min (ref >90)
GFR calc non Af Amer: >90 mL/min (ref >90)
Glucose, Bld: 89 mg/dL (ref 70–99)
Total Bilirubin: 0.2 mg/dL — ABNORMAL LOW (ref 0.3–1.2)

## 2013-12-11 LAB — POCT PREGNANCY, URINE: Preg Test, Ur: NEGATIVE

## 2013-12-11 LAB — URINALYSIS, ROUTINE W REFLEX MICROSCOPIC
Glucose, UA: NEGATIVE mg/dL
Hgb urine dipstick: NEGATIVE
Ketones, ur: NEGATIVE mg/dL
Leukocytes, UA: NEGATIVE
Protein, ur: NEGATIVE mg/dL
Urobilinogen, UA: 0.2 mg/dL (ref 0.0–1.0)

## 2013-12-11 MED ORDER — HYDROCODONE-ACETAMINOPHEN 5-325 MG PO TABS: 2.0000 | ORAL_TABLET | ORAL | Status: DC | PRN

## 2013-12-11 MED ORDER — OMEPRAZOLE 20 MG PO CPDR: 20.0000 mg | DELAYED_RELEASE_CAPSULE | Freq: Two times a day (BID) | ORAL | Status: DC

## 2013-12-11 MED ORDER — ONDANSETRON 4 MG PO TBDP
4.0000 mg | ORAL_TABLET | Freq: Once | ORAL | Status: AC
  Administered 2013-12-11: 4 mg via ORAL
  Filled 2013-12-11: qty 1

## 2013-12-11 MED ORDER — HYDROCODONE-ACETAMINOPHEN 5-325 MG PO TABS
1.0000 | ORAL_TABLET | Freq: Once | ORAL | Status: AC
  Administered 2013-12-11: 1 via ORAL
  Filled 2013-12-11: qty 1

## 2013-12-11 MED ORDER — PANTOPRAZOLE SODIUM 40 MG PO TBEC
40.0000 mg | DELAYED_RELEASE_TABLET | Freq: Once | ORAL | Status: AC
  Administered 2013-12-11: 40 mg via ORAL
  Filled 2013-12-11: qty 1

## 2013-12-11 MED ORDER — ONDANSETRON 4 MG PO TBDP: 4.0000 mg | ORAL_TABLET | Freq: Three times a day (TID) | ORAL | Status: DC | PRN

## 2013-12-11 NOTE — ED Notes (Signed)
C/O upper abdominal pain x 2+ weeks.  Recently, pain has moved to lower abdomen.  Reports more frequent urination today but denies pain with urination.

## 2013-12-12 NOTE — ED Provider Notes (Signed)
CSN: [id number]     Arrival date & time 12/10/13  2315 History   First MD Initiated Contact with Patient 12/11/13 0117     Chief Complaint  Patient presents with  . Abdominal Pain    HPI  [name] had abdominal pain for a few weeks. Epigastric burning in nature. [name] not had a good appetite she is frequently nauseated. It is worse early in the morning. It is epigastric. It has not migrated to her right upper quadrant right lower quadrant to her back. She does have Prilosec at home she takes occasionally for heartburn but not a regular basis. Other exacerbating or alleviating factors.  Past Medical History  Diagnosis Date  . Arthritis   . Asthma   . Hypertension   . Obesity    Past Surgical History  Procedure Laterality Date  . Fracture surgery    . Tubal ligation     History reviewed. No pertinent family history. History  Substance Use Topics  . Smoking status: Never Smoker   . Smokeless tobacco: Not on file  . Alcohol Use: No   OB History   Grav Para Term Preterm Abortions TAB SAB Ect Mult Living                 Review of Systems  Constitutional: Negative for fever, chills, diaphoresis, appetite change and fatigue.  HENT: Negative for mouth sores, sore throat and trouble swallowing.   Eyes: Negative for visual disturbance.  Respiratory: Negative for cough, chest tightness, shortness of breath and wheezing.   Cardiovascular: Negative for chest pain.  Gastrointestinal: Positive for nausea and abdominal pain. Negative for vomiting, diarrhea and abdominal distention.  Endocrine: Negative for polydipsia, polyphagia and polyuria.  Genitourinary: Negative for dysuria, frequency and hematuria.  Musculoskeletal: Negative for gait problem.  Skin: Negative for color change, pallor and rash.  Neurological: Negative for dizziness, syncope, light-headedness and headaches.  Hematological: Does not bruise/bleed easily.  Psychiatric/Behavioral: Negative for behavioral problems and  confusion.    Allergies  Review of patient's allergies indicates no known allergies.  Home Medications   Current Outpatient Rx  Name  Route  Sig  Dispense  Refill  . albuterol (PROVENTIL HFA;VENTOLIN HFA) 108 (90 BASE) MCG/ACT inhaler   Inhalation   Inhale 2 puffs into the lungs every 6 (six) hours as needed. For shortness of breath/wheezing         . aspirin 81 MG chewable tablet   Oral   Chew 81 mg by mouth daily.          . chlorthalidone (HYGROTON) 25 MG tablet   Oral   Take 25 mg by mouth daily.         Marland Kitchen omeprazole (PRILOSEC) 20 MG capsule   Oral   Take 20 mg by mouth daily.         Marland Kitchen HYDROcodone-acetaminophen (NORCO/VICODIN) 5-325 MG per tablet   Oral   Take 2 tablets by mouth every 4 (four) hours as needed.   10 tablet   0   . omeprazole (PRILOSEC) 20 MG capsule   Oral   Take 1 capsule (20 mg total) by mouth 2 (two) times daily.   60 capsule   0   . ondansetron (ZOFRAN ODT) 4 MG disintegrating tablet   Oral   Take 1 tablet (4 mg total) by mouth every 8 (eight) hours as needed for nausea.   20 tablet   0    BP 179/91  Pulse 80  Temp(Src) 97.9 F (36.6  C) (Oral)  Resp 18  Wt 250 lb (113.399 kg)  SpO2 98%  LMP 11/04/2011 Physical Exam  Constitutional: She is oriented to person, place, and time. She appears well-developed and well-nourished. No distress.  HENT:  Head: Normocephalic.  Eyes: Conjunctivae are normal. Pupils are equal, round, and reactive to light. No scleral icterus.  No scleral icterus. Conjunctiva are not pale  Neck: Normal range of motion. Neck supple. No thyromegaly present.  Cardiovascular: Normal rate and regular rhythm.  Exam reveals no gallop and no friction rub.   No murmur heard. Pulmonary/Chest: Effort normal and breath sounds normal. No respiratory distress. She has no wheezes. She has no rales.  Abdominal: Soft. Bowel sounds are normal. She exhibits no distension. There is no tenderness. There is no rebound.   Epigastric tenderness. No peritoneal irritation. No localizing right upper quadrant, or right lower quadrant pain.  Musculoskeletal: Normal range of motion.  Neurological: She is alert and oriented to person, place, and time.  Skin: Skin is warm and dry. No rash noted.  Psychiatric: She has a normal mood and affect. Her behavior is normal.    ED Course  Procedures (including critical care time) Labs Review Labs Reviewed  CBC WITH DIFFERENTIAL - Abnormal; Notable for the following:    Neutrophils Relative % 37 (*)    Lymphocytes Relative 53 (*)    All other components within normal limits  COMPREHENSIVE METABOLIC PANEL - Abnormal; Notable for the following:    Albumin 3.4 (*)    Total Bilirubin 0.2 (*)    All other components within normal limits  LIPASE, BLOOD  URINALYSIS, ROUTINE W REFLEX MICROSCOPIC  POCT PREGNANCY, URINE   Imaging Review No results found.  EKG Interpretation   None       MDM   1. Abdominal pain   2. Peptic ulcer disease    Her symptoms sound consistent with peptic ulcer disease. She has early satiety. Some pain at night. Appetite has been off. No weight loss. Is not tender as I would expect with cholecystitis or appendicitis. No white blood cell count elevation.    Rolland Porter, MD 12/12/13 [phone]

## 2014-05-21 ENCOUNTER — Emergency Department (HOSPITAL_COMMUNITY): Payer: Medicaid Other

## 2014-05-21 ENCOUNTER — Emergency Department (HOSPITAL_COMMUNITY)
Admission: EM | Admit: 2014-05-21 | Discharge: 2014-05-21 | Disposition: A | Discharge status: Home / Self Care | Payer: Medicaid Other | Attending: Emergency Medicine | Admitting: Emergency Medicine

## 2014-05-21 ENCOUNTER — Encounter (HOSPITAL_COMMUNITY): Payer: Self-pay | Admitting: Emergency Medicine

## 2014-05-21 DIAGNOSIS — Z7982 Long term (current) use of aspirin: Secondary | ICD-10-CM | POA: Insufficient documentation

## 2014-05-21 DIAGNOSIS — M129 Arthropathy, unspecified: Secondary | ICD-10-CM | POA: Insufficient documentation

## 2014-05-21 DIAGNOSIS — J45909 Unspecified asthma, uncomplicated: Secondary | ICD-10-CM | POA: Insufficient documentation

## 2014-05-21 DIAGNOSIS — E669 Obesity, unspecified: Secondary | ICD-10-CM | POA: Insufficient documentation

## 2014-05-21 DIAGNOSIS — B9789 Other viral agents as the cause of diseases classified elsewhere: Secondary | ICD-10-CM

## 2014-05-21 DIAGNOSIS — J029 Acute pharyngitis, unspecified: Secondary | ICD-10-CM

## 2014-05-21 DIAGNOSIS — I10 Essential (primary) hypertension: Secondary | ICD-10-CM | POA: Insufficient documentation

## 2014-05-21 DIAGNOSIS — Z79899 Other long term (current) drug therapy: Secondary | ICD-10-CM | POA: Insufficient documentation

## 2014-05-21 DIAGNOSIS — J069 Acute upper respiratory infection, unspecified: Secondary | ICD-10-CM

## 2014-05-21 LAB — I-STAT CHEM 8, ED
BUN: 8 mg/dL (ref 6–23)
CREATININE: 0.7 mg/dL (ref 0.50–1.10)
Calcium, Ion: 1.15 mmol/L (ref 1.12–1.23)
Chloride: 103 mEq/L (ref 96–112)
Glucose, Bld: 85 mg/dL (ref 70–99)
HEMATOCRIT: 46 % (ref 36.0–46.0)
HEMOGLOBIN: 15.6 g/dL — AB (ref 12.0–15.0)
POTASSIUM: 3.5 meq/L — AB (ref 3.7–5.3)
SODIUM: 140 meq/L (ref 137–147)
TCO2: 29 mmol/L (ref 0–100)

## 2014-05-21 MED ORDER — BENZOCAINE 20 % MT SOLN
Freq: Once | OROMUCOSAL | Status: AC
  Administered 2014-05-21: 19:00:00 via OROMUCOSAL
  Filled 2014-05-21: qty 57

## 2014-05-21 MED ORDER — SODIUM CHLORIDE 0.9 % IV BOLUS (SEPSIS)
1000.0000 mL | Freq: Once | INTRAVENOUS | Status: AC
  Administered 2014-05-21: 1000 mL via INTRAVENOUS

## 2014-05-21 MED ORDER — DEXAMETHASONE 6 MG PO TABS
10.0000 mg | ORAL_TABLET | Freq: Once | ORAL | Status: AC
  Administered 2014-05-21: 10 mg via ORAL
  Filled 2014-05-21: qty 1

## 2014-05-21 MED ORDER — BENZOCAINE 10 MG MT LOZG: 1.0000 | LOZENGE | Freq: Three times a day (TID) | OROMUCOSAL | Status: DC | PRN

## 2014-05-21 NOTE — Discharge Instructions (Signed)
Cough, Adult  A cough is a reflex. It helps you clear your throat and airways. A cough can help heal your body. A cough can last 2 or 3 weeks (acute) or may last more than 8 weeks (chronic). Some common causes of a cough can include an infection, allergy, or a cold. HOME CARE  Only take medicine as told by your doctor.  If given, take your medicines (antibiotics) as told. Finish them even if you start to feel better.  Use a cold steam vaporizer or humidier in your home. This can help loosen thick spit (secretions).  Sleep so you are almost sitting up (semi-upright). Use pillows to do this. This helps reduce coughing.  Rest as needed.  Stop smoking if you smoke. GET HELP RIGHT AWAY IF:  You have yellowish-white fluid (pus) in your thick spit.  Your cough gets worse.  Your medicine does not reduce coughing, and you are losing sleep.  You cough up blood.  You have trouble breathing.  Your pain gets worse and medicine does not help.  You have a fever. MAKE SURE YOU:   Understand these instructions.  Will watch your condition.  Will get help right away if you are not doing well or get worse. Document Released: 08/19/2011 Document Revised: 02/28/2012 Document Reviewed: 08/19/2011 [facility] Patient Information 2014 [place]. Sore Throat A sore throat is pain, burning, irritation, or scratchiness of the throat. There is often pain or tenderness when swallowing or talking. A sore throat may be accompanied by other symptoms, such as coughing, sneezing, fever, and swollen neck glands. A sore throat is often the first sign of another sickness, such as a cold, flu, strep throat, or mononucleosis (commonly known as mono). Most sore throats go away without medical treatment. CAUSES  The most common causes of a sore throat include:  A viral infection, such as a cold, flu, or mono.  A bacterial infection, such as strep throat, tonsillitis, or whooping cough.  Seasonal  allergies.  Dryness in the air.  Irritants, such as smoke or pollution.  Gastroesophageal reflux disease (GERD). HOME CARE INSTRUCTIONS   Only take over-the-counter medicines as directed by your caregiver.  Drink enough fluids to keep your urine clear or pale yellow.  Rest as needed.  Try using throat sprays, lozenges, or sucking on hard candy to ease any pain (if older than 4 years or as directed).  Sip warm liquids, such as broth, herbal tea, or warm water with honey to relieve pain temporarily. You may also eat or drink cold or frozen liquids such as frozen ice pops.  Gargle with salt water (mix 1 tsp salt with 8 oz of water).  Do not smoke and avoid secondhand smoke.  Put a cool-mist humidifier in your bedroom at night to moisten the air. You can also turn on a hot shower and sit in the bathroom with the door closed for 5 10 minutes. SEEK IMMEDIATE MEDICAL CARE IF:  You have difficulty breathing.  You are unable to swallow fluids, soft foods, or your saliva.  You have increased swelling in the throat.  Your sore throat does not get better in 7 days.  You have nausea and vomiting.  You have a fever or persistent symptoms for more than 2 3 days.  You have a fever and your symptoms suddenly get worse. MAKE SURE YOU:   Understand these instructions.  Will watch your condition.  Will get help right away if you are not doing well or get  worse. Document Released: 01/13/2005 Document Revised: 11/22/2012 Document Reviewed: 08/13/2012 [facility] Patient Information 2014 [employer], [place].

## 2014-05-21 NOTE — ED Notes (Signed)
Pt reports having productive cough x 1 week, and now having sore throat for several days. Pt is hypertensive at triage, reports taking meds as prescribed, airway intact.

## 2014-05-21 NOTE — ED Provider Notes (Signed)
I saw and evaluated the patient, reviewed the resident's note and I agree with the findings and plan.   EKG Interpretation None     Pt here with sore throat and cough productive of yellow sputum--subjective fever, suspect possible CAP, will check cxr and iv hydrate  Leota Jacobsen, MD 05/21/14 1925

## 2014-05-21 NOTE — ED Notes (Signed)
Pt c/o sore throat x's 3 days.  Productive cough (yellow) x's 1 week.

## 2014-05-21 NOTE — ED Notes (Signed)
Pt placed on cardiac monitor, B/P and pulse ox.

## 2014-05-21 NOTE — ED Provider Notes (Signed)
CSN: [id number]     Arrival date & time 05/21/14  1646 History   First MD Initiated Contact with Patient 05/21/14 1748     Chief Complaint  Patient presents with  . Cough  . Sore Throat     (Consider location/radiation/quality/duration/timing/severity/associated sxs/prior Treatment) Patient is a 48 y.o. female presenting with cough. The history is provided by the patient.  Cough Cough characteristics:  Productive Sputum characteristics:  Yellow Severity:  Mild Onset quality:  Gradual Duration:  1 week Timing:  Constant Progression:  Worsening Chronicity:  New Context: upper respiratory infection   Context: not sick contacts   Relieved by:  Nothing Worsened by:  Nothing tried Ineffective treatments:  None tried Associated symptoms: fever (low-grade, subjective) and sore throat   Associated symptoms: no chest pain, no chills, no rash and no shortness of breath     Past Medical History  Diagnosis Date  . Arthritis   . Asthma   . Hypertension   . Obesity    Past Surgical History  Procedure Laterality Date  . Fracture surgery    . Tubal ligation     History reviewed. No pertinent family history. History  Substance Use Topics  . Smoking status: Never Smoker   . Smokeless tobacco: Not on file  . Alcohol Use: No   OB History   Grav Para Term Preterm Abortions TAB SAB Ect Mult Living                 Review of Systems  Constitutional: Positive for fever (low-grade, subjective). Negative for chills.  HENT: Positive for sore throat.   Respiratory: Positive for cough. Negative for shortness of breath.   Cardiovascular: Negative for chest pain.  Skin: Negative for rash.  All other systems reviewed and are negative.     Allergies  Doxycycline  Home Medications   Prior to Admission medications   Medication Sig Start Date End Date Taking? Authorizing Provider  albuterol (PROVENTIL HFA;VENTOLIN HFA) 108 (90 BASE) MCG/ACT inhaler Inhale 2 puffs into the lungs every  6 (six) hours as needed. For shortness of breath/wheezing   Yes Historical Provider, MD  aspirin 81 MG chewable tablet Chew 81 mg by mouth daily.    Yes Historical Provider, MD  chlorthalidone (HYGROTON) 25 MG tablet Take 25 mg by mouth. 12/24/13  Yes Historical Provider, MD  omeprazole (PRILOSEC) 20 MG capsule Take 20 mg by mouth daily.   Yes Historical Provider, MD  ondansetron (ZOFRAN ODT) 4 MG disintegrating tablet Take 1 tablet (4 mg total) by mouth every 8 (eight) hours as needed for nausea. 12/11/13  Yes Tanna Furry, MD   BP 170/94  Pulse 66  Temp(Src) 99.3 F (37.4 C) (Oral)  Resp 18  Wt 240 lb (108.863 kg)  SpO2 100%  LMP 11/04/2011 Physical Exam  Constitutional: She is oriented to person, place, and time. She appears well-developed and well-nourished. No distress.  HENT:  Head: Normocephalic.  Mouth/Throat: No uvula swelling. Posterior oropharyngeal erythema present. No oropharyngeal exudate or posterior oropharyngeal edema.  Eyes: Conjunctivae are normal.  Neck: Neck supple. No tracheal deviation present.  Cardiovascular: Normal rate, regular rhythm and normal heart sounds.   Pulmonary/Chest: Effort normal and breath sounds normal. No respiratory distress.  Abdominal: Soft. She exhibits no distension.  Neurological: She is alert and oriented to person, place, and time.  Skin: Skin is warm and dry.  Psychiatric: She has a normal mood and affect.    ED Course  Procedures (including critical care time)  Labs Review Labs Reviewed  I-STAT CHEM 8, ED - Abnormal; Notable for the following:    Potassium 3.5 (*)    Hemoglobin 15.6 (*)    All other components within normal limits    Imaging Review Dg Chest 2 View  05/21/2014   CLINICAL DATA:  Chest pain and cough.  EXAM: CHEST  2 VIEW  COMPARISON:  None.  FINDINGS: Trachea is midline. Heart is at the upper limits of normal in size. Lungs are clear. No pleural fluid.  IMPRESSION: No acute findings.   Electronically Signed   By:  Lorin Picket M.D.   On: 05/21/2014 20:12     EKG Interpretation None      MDM   Final diagnoses:  Viral URI with cough  Sore throat   48 y.o. female presents with cough over the last week, discomfort, and sore throat for the last few days. Suspect viral etiology but given duration of symptoms will obtain screening x-ray to rule out pneumonia. No exudate on tonsils, no history of exposure to strep, has had a cough, doubt clinically.  Patient does not have pneumonia, was discharged after a dose of Decadron for her sore throat and given benzocaine lozenges for comfort in the interim.   Leo Grosser, MD 05/21/14 [phone]

## 2014-05-21 NOTE — ED Notes (Signed)
Patient transported to X-ray 

## 2014-05-24 NOTE — ED Provider Notes (Signed)
I saw and evaluated the patient, reviewed the resident's note and I agree with the findings and plan.   EKG Interpretation None       Leota Jacobsen, MD 05/24/14 [phone]

## 2014-06-04 ENCOUNTER — Encounter (HOSPITAL_COMMUNITY): Payer: Self-pay

## 2014-06-04 ENCOUNTER — Inpatient Hospital Stay (HOSPITAL_COMMUNITY)
Admission: AD | Admit: 2014-06-04 | Discharge: 2014-06-04 | Disposition: A | Discharge status: Home / Self Care | Payer: Medicaid Other | Source: Ambulatory Visit | Attending: Obstetrics & Gynecology | Admitting: Obstetrics & Gynecology

## 2014-06-04 DIAGNOSIS — I10 Essential (primary) hypertension: Secondary | ICD-10-CM | POA: Insufficient documentation

## 2014-06-04 DIAGNOSIS — R109 Unspecified abdominal pain: Secondary | ICD-10-CM | POA: Insufficient documentation

## 2014-06-04 DIAGNOSIS — N898 Other specified noninflammatory disorders of vagina: Secondary | ICD-10-CM | POA: Insufficient documentation

## 2014-06-04 DIAGNOSIS — R110 Nausea: Secondary | ICD-10-CM | POA: Insufficient documentation

## 2014-06-04 HISTORY — DX: Gastro-esophageal reflux disease without esophagitis: K21.9

## 2014-06-04 HISTORY — DX: Major depressive disorder, single episode, unspecified: F32.9

## 2014-06-04 HISTORY — DX: Acute myocardial infarction, unspecified: I21.9

## 2014-06-04 HISTORY — DX: Depression, unspecified: F32.A

## 2014-06-04 LAB — URINALYSIS, ROUTINE W REFLEX MICROSCOPIC
Bilirubin Urine: NEGATIVE
Glucose, UA: NEGATIVE mg/dL
HGB URINE DIPSTICK: NEGATIVE
Ketones, ur: NEGATIVE mg/dL
LEUKOCYTES UA: NEGATIVE
NITRITE: NEGATIVE
Protein, ur: NEGATIVE mg/dL
SPECIFIC GRAVITY, URINE: 1.025 (ref 1.005–1.030)
UROBILINOGEN UA: 0.2 mg/dL (ref 0.0–1.0)
pH: 5.5 (ref 5.0–8.0)

## 2014-06-04 LAB — WET PREP, GENITAL
Clue Cells Wet Prep HPF POC: NONE SEEN
TRICH WET PREP: NONE SEEN
YEAST WET PREP: NONE SEEN

## 2014-06-04 LAB — POCT PREGNANCY, URINE: PREG TEST UR: NEGATIVE

## 2014-06-04 LAB — GC/CHLAMYDIA PROBE AMP
CT Probe RNA: NEGATIVE
GC Probe RNA: NEGATIVE

## 2014-06-04 NOTE — MAU Note (Signed)
Pt c/o nausea and lower abdominal pain that started yesterday. Took tylenol last night for pain but it did not help. Denies vaginal bleeding. States some discharge that has an odor-thick and clear. Has not had period in a year

## 2014-06-04 NOTE — MAU Provider Note (Signed)
History     CSN: [id number]  Arrival date and time: 06/04/14 0017   First Provider Initiated Contact with Patient 06/04/14 0113      Chief Complaint  Patient presents with  . Nausea  . Abdominal Pain   Abdominal Pain    Whitney Boyer is a 48 y.o. who presents today with nausea, and vaginal discharge. She states that she has had the nausea for about 2 days. She has had the discharge for a few days as well. She is unsure of when it started. However, it is white and has an odor. She states that she has not had a period in a year. She is recently relocated to the area, and was seeing a Dr. In [place]. She is on metoprolol and "something else" for her blood pressure. She ran out her meds and her doctor only called in a RX for the metoprolol. She doesn't have a doctor in town. She would like to have STD testing today.    Past Medical History  Diagnosis Date  . Arthritis   . Asthma   . Hypertension   . Obesity     Past Surgical History  Procedure Laterality Date  . Fracture surgery    . Tubal ligation      No family history on file.  History  Substance Use Topics  . Smoking status: Never Smoker   . Smokeless tobacco: Not on file  . Alcohol Use: No    Allergies:  Allergies  Allergen Reactions  . Doxycycline Nausea And Vomiting    Prescriptions prior to admission  Medication Sig Dispense Refill  . albuterol (PROVENTIL HFA;VENTOLIN HFA) 108 (90 BASE) MCG/ACT inhaler Inhale 2 puffs into the lungs every 6 (six) hours as needed. For shortness of breath/wheezing      . aspirin 81 MG chewable tablet Chew 81 mg by mouth daily.       . Benzocaine 10 MG LOZG Use as directed 1 lozenge (10 mg total) in the mouth or throat every 8 (eight) hours as needed.  4 lozenge  0  . chlorthalidone (HYGROTON) 25 MG tablet Take 25 mg by mouth.      Marland Kitchen omeprazole (PRILOSEC) 20 MG capsule Take 20 mg by mouth daily.      . ondansetron (ZOFRAN ODT) 4 MG disintegrating tablet Take 1  tablet (4 mg total) by mouth every 8 (eight) hours as needed for nausea.  20 tablet  0    Review of Systems  Gastrointestinal: Positive for abdominal pain.   Physical Exam   Blood pressure 176/99, pulse 61, temperature 97.9 F (36.6 C), temperature source Oral, resp. rate 20, height 5' 1.2" (1.554 m), weight 110.315 kg (243 lb 3.2 oz), last menstrual period 11/04/2011, SpO2 100.00%.  Physical Exam  Nursing note and vitals reviewed. Constitutional: She is oriented to person, place, and time. She appears well-developed and well-nourished. No distress.  Cardiovascular: Normal rate.   Respiratory: Effort normal.  GI: Soft. There is no tenderness.  Genitourinary:   External: no lesion Vagina: small amount of white discharge Cervix: pink, smooth, no CMT Uterus: NSSC Adnexa: NT   Neurological: She is alert and oriented to person, place, and time.  Skin: Skin is warm and dry.  Psychiatric: She has a normal mood and affect.    MAU Course  Procedures  Results for orders placed during the hospital encounter of 06/04/14 (from the past 24 hour(s))  URINALYSIS, ROUTINE W REFLEX MICROSCOPIC     Status: None  Collection Time    06/04/14 12:58 AM      Result Value Ref Range   Color, Urine YELLOW  YELLOW   APPearance CLEAR  CLEAR   Specific Gravity, Urine 1.025  1.005 - 1.030   pH 5.5  5.0 - 8.0   Glucose, UA NEGATIVE  NEGATIVE mg/dL   Hgb urine dipstick NEGATIVE  NEGATIVE   Bilirubin Urine NEGATIVE  NEGATIVE   Ketones, ur NEGATIVE  NEGATIVE mg/dL   Protein, ur NEGATIVE  NEGATIVE mg/dL   Urobilinogen, UA 0.2  0.0 - 1.0 mg/dL   Nitrite NEGATIVE  NEGATIVE   Leukocytes, UA NEGATIVE  NEGATIVE  POCT PREGNANCY, URINE     Status: None   Collection Time    06/04/14  1:13 AM      Result Value Ref Range   Preg Test, Ur NEGATIVE  NEGATIVE  WET PREP, GENITAL     Status: Abnormal   Collection Time    06/04/14  1:19 AM      Result Value Ref Range   Yeast Wet Prep HPF POC NONE SEEN  NONE  SEEN   Trich, Wet Prep NONE SEEN  NONE SEEN   Clue Cells Wet Prep HPF POC NONE SEEN  NONE SEEN   WBC, Wet Prep HPF POC MODERATE (*) NONE SEEN     Assessment and Plan   1. Vaginal discharge    GC/CT pending Patient advised to contact doctor about the name of her blood pressure medications On review of care everywhere, B/P stable, and at baseline today.  FU with the ED if needed   Mathis Bud 06/04/2014, 1:21 AM

## 2014-06-04 NOTE — Discharge Instructions (Signed)

## 2014-06-04 NOTE — MAU Provider Note (Signed)
Attestation of Attending Supervision of Advanced Practitioner (CNM/NP): Evaluation and management procedures were performed by the Advanced Practitioner under my supervision and collaboration.  I have reviewed the Advanced Practitioner's note and chart, and I agree with the management and plan.  HARRAWAY-SMITH, CAROLYN 7:06 AM

## 2014-06-07 ENCOUNTER — Telehealth: Payer: Self-pay | Admitting: *Deleted

## 2014-06-07 NOTE — Telephone Encounter (Signed)
Pt called nurse line for results.  Attempted to contact patient, no answer, left message for patient to return call to clinic for results.

## 2014-06-11 NOTE — Telephone Encounter (Signed)
Called patient and informed her of normal/negative results. Patient verbalized understanding and had no further questions

## 2014-10-02 ENCOUNTER — Emergency Department: Payer: Self-pay | Admitting: Emergency Medicine

## 2014-10-21 ENCOUNTER — Encounter (HOSPITAL_COMMUNITY): Payer: Self-pay

## 2014-12-14 ENCOUNTER — Emergency Department (HOSPITAL_COMMUNITY)
Admission: EM | Admit: 2014-12-14 | Discharge: 2014-12-15 | Disposition: A | Discharge status: Home Health | Payer: Medicaid Other | Attending: Emergency Medicine | Admitting: Emergency Medicine

## 2014-12-14 ENCOUNTER — Emergency Department (HOSPITAL_COMMUNITY): Payer: Medicaid Other

## 2014-12-14 ENCOUNTER — Encounter (HOSPITAL_COMMUNITY): Payer: Self-pay | Admitting: Emergency Medicine

## 2014-12-14 DIAGNOSIS — J45909 Unspecified asthma, uncomplicated: Secondary | ICD-10-CM | POA: Insufficient documentation

## 2014-12-14 DIAGNOSIS — K219 Gastro-esophageal reflux disease without esophagitis: Secondary | ICD-10-CM | POA: Diagnosis not present

## 2014-12-14 DIAGNOSIS — Y9301 Activity, walking, marching and hiking: Secondary | ICD-10-CM | POA: Insufficient documentation

## 2014-12-14 DIAGNOSIS — I252 Old myocardial infarction: Secondary | ICD-10-CM | POA: Diagnosis not present

## 2014-12-14 DIAGNOSIS — I10 Essential (primary) hypertension: Secondary | ICD-10-CM | POA: Insufficient documentation

## 2014-12-14 DIAGNOSIS — Z8659 Personal history of other mental and behavioral disorders: Secondary | ICD-10-CM | POA: Diagnosis not present

## 2014-12-14 DIAGNOSIS — Y998 Other external cause status: Secondary | ICD-10-CM | POA: Insufficient documentation

## 2014-12-14 DIAGNOSIS — S8001XA Contusion of right knee, initial encounter: Secondary | ICD-10-CM

## 2014-12-14 DIAGNOSIS — W010XXA Fall on same level from slipping, tripping and stumbling without subsequent striking against object, initial encounter: Secondary | ICD-10-CM | POA: Insufficient documentation

## 2014-12-14 DIAGNOSIS — Y9289 Other specified places as the place of occurrence of the external cause: Secondary | ICD-10-CM | POA: Insufficient documentation

## 2014-12-14 DIAGNOSIS — W19XXXA Unspecified fall, initial encounter: Secondary | ICD-10-CM

## 2014-12-14 DIAGNOSIS — M1990 Unspecified osteoarthritis, unspecified site: Secondary | ICD-10-CM | POA: Diagnosis not present

## 2014-12-14 DIAGNOSIS — T1490XA Injury, unspecified, initial encounter: Secondary | ICD-10-CM

## 2014-12-14 DIAGNOSIS — M25561 Pain in right knee: Secondary | ICD-10-CM

## 2014-12-14 DIAGNOSIS — Z7982 Long term (current) use of aspirin: Secondary | ICD-10-CM | POA: Insufficient documentation

## 2014-12-14 DIAGNOSIS — S8991XA Unspecified injury of right lower leg, initial encounter: Secondary | ICD-10-CM | POA: Diagnosis present

## 2014-12-14 DIAGNOSIS — R52 Pain, unspecified: Secondary | ICD-10-CM

## 2014-12-14 DIAGNOSIS — E669 Obesity, unspecified: Secondary | ICD-10-CM | POA: Insufficient documentation

## 2014-12-14 DIAGNOSIS — Z79899 Other long term (current) drug therapy: Secondary | ICD-10-CM | POA: Insufficient documentation

## 2014-12-14 MED ORDER — HYDROCODONE-ACETAMINOPHEN 5-325 MG PO TABS: 1.0000 | ORAL_TABLET | Freq: Four times a day (QID) | ORAL | Status: DC | PRN

## 2014-12-14 NOTE — ED Provider Notes (Signed)
CSN: [id number]     Arrival date & time 12/14/14  2012 History  This chart was scribed for non-physician practitioner working with No att. providers found by Mercy Moore, ED Scribe. This patient was seen in room TR09C/TR09C and the patient's care was started at 11:41 PM.   Chief Complaint  Patient presents with  . Knee Injury   The history is provided by the patient. No language interpreter was used.   HPI Comments: Whitney Boyer is a 48 y.o. female with PMHx of arthritis, Hypertension, obestiy, MI, and GERD who presents to the Emergency Department with right knee injury incurred earlier today. Patient reports tripping, falling and landing on her right knee. Patient denies head injury or loss of consciousness. Patient is now complaining of constant aching pain located at the medial aspect. Patient reports sharp pain sensation with walking. Patient states that it feels as if her knee gets stuck. Patient reports previous falls on her right knee and states that this is her fifth fall.  Patient reports injuring her right knee five years ago. Patient reports treatment with Tramadol at home, but denies relief.  Past Medical History  Diagnosis Date  . Arthritis   . Asthma   . Hypertension   . Obesity   . GERD (gastroesophageal reflux disease)   . Myocardial infarction 10/2012  . Depression    Past Surgical History  Procedure Laterality Date  . Fracture surgery  2004    broken finger repair  . Tubal ligation  1999   No family history on file. History  Substance Use Topics  . Smoking status: Never Smoker   . Smokeless tobacco: Not on file  . Alcohol Use: No   OB History    Gravida Para Term Preterm AB TAB SAB Ectopic Multiple Living   2 2        2      Review of Systems  Constitutional: Negative for fever and chills.  Musculoskeletal: Positive for joint swelling and arthralgias. Negative for gait problem.  Skin: Negative for wound.  Neurological: Negative for weakness and  numbness.   Allergies  Doxycycline  Home Medications   Prior to Admission medications   Medication Sig Start Date End Date Taking? Authorizing Provider  albuterol (PROVENTIL HFA;VENTOLIN HFA) 108 (90 BASE) MCG/ACT inhaler Inhale 2 puffs into the lungs every 6 (six) hours as needed. For shortness of breath/wheezing   Yes Historical Provider, MD  aspirin 81 MG chewable tablet Chew 81 mg by mouth daily.    Yes Historical Provider, MD  chlorthalidone (HYGROTON) 25 MG tablet Take 25 mg by mouth. 12/24/13  Yes Historical Provider, MD  omeprazole (PRILOSEC) 20 MG capsule Take 20 mg by mouth daily.   Yes Historical Provider, MD  Benzocaine 10 MG LOZG Use as directed 1 lozenge (10 mg total) in the mouth or throat every 8 (eight) hours as needed. Patient not taking: Reported on 12/14/2014 05/21/14   Leo Grosser, MD  HYDROcodone-acetaminophen (NORCO/VICODIN) 5-325 MG per tablet Take 1-2 tablets by mouth every 6 (six) hours as needed for moderate pain or severe pain. 12/14/14   Carrie Mew, PA-C  ondansetron (ZOFRAN ODT) 4 MG disintegrating tablet Take 1 tablet (4 mg total) by mouth every 8 (eight) hours as needed for nausea. Patient not taking: Reported on 12/14/2014 12/11/13   Tanna Furry, MD   Triage Vitals: BP 127/70 mmHg  Pulse 81  Temp(Src) 98.1 F (36.7 C) (Oral)  Resp 16  SpO2 99%  LMP 11/04/2011 Physical Exam  Constitutional: She is oriented to person, place, and time. She appears well-developed and well-nourished. No distress.  HENT:  Head: Normocephalic and atraumatic.  Eyes: EOM are normal.  Neck: Neck supple. No tracheal deviation present.  Cardiovascular: Normal rate.   Pulmonary/Chest: Effort normal. No respiratory distress.  Musculoskeletal: Normal range of motion.  Moderate effusion to right knee. No anterior/posterior instability. Full active and passive ROM. DP pulses 2+. Distal sensation intact. 5/5 motor strength in hip, knee and ankle.   Neurological: She is alert and  oriented to person, place, and time.  Skin: Skin is warm and dry.  Psychiatric: She has a normal mood and affect. Her behavior is normal.  Nursing note and vitals reviewed.   ED Course  Procedures (including critical care time)  COORDINATION OF CARE: 11:47 PM- Will refer patient to ortho. Plans to fit patient with knee sleeve. Discussed treatment plan with patient at bedside and patient agreed to plan.   Labs Review Labs Reviewed - No data to display  Imaging Review Dg Knee Complete 4 Views Right  12/14/2014   CLINICAL DATA:  [place] 2 weeks ago. Trip and fall today, generalized knee pain.  EXAM: RIGHT KNEE - COMPLETE 4+ VIEW  COMPARISON:  None.  FINDINGS: No acute fracture deformity or dislocation. Mild medial and patellofemoral compartment compartment marginal spurring consistent with early osteoarthrosis. Joint space intact without erosions. No destructive bony lesions. Moderate suprapatellar joint effusion, no subcutaneous gas or radiopaque foreign bodies.  IMPRESSION: Moderate suprapatellar joint effusion. No acute fracture deformity or dislocation.  Early medial and patellofemoral compartment osteoarthrosis.   Electronically Signed   By: Elon Alas   On: 12/14/2014 23:18     EKG Interpretation None      MDM   Final diagnoses:  Right knee pain  Knee contusion, right, initial encounter   Patient here with knee pain status post fall. Radiographs with impression of a moderate suprapatellar joint effusion, with no acute fracture deformity or dislocation. These findings are consistent with exam. Patient neurovascularly intact, knee sleeve placed to help with stability, and comfort RICE therapy discussed with patient, and I recommended patient follow with orthopedics. I discussed return precautions with patient, and patient was agreeable to this plan. I encouraged patient to call or return to the ER should she have any questions or concerns.  I personally performed the services  described in this documentation, which was scribed in my presence. The recorded information has been reviewed and is accurate.  BP 127/70 mmHg  Pulse 81  Temp(Src) 98.1 F (36.7 C) (Oral)  Resp 16  SpO2 99%  LMP 11/04/2011  Signed,  Dahlia Bailiff, PA-C 4:54 AM    Carrie Mew, PA-C 12/15/14 [id number]  Dorie Rank, MD 12/17/14 1022

## 2014-12-14 NOTE — ED Notes (Signed)
Patient returned from X-ray 

## 2014-12-14 NOTE — ED Notes (Signed)
Pt. tripped and fell today while walking , reports pain at right knee with mild swelling , no LOC / ambulatory.

## 2014-12-14 NOTE — Discharge Instructions (Signed)

## 2014-12-15 NOTE — ED Notes (Signed)
Knee sleeve applied with instructions for use

## 2015-03-11 ENCOUNTER — Emergency Department (HOSPITAL_COMMUNITY)
Admission: EM | Admit: 2015-03-11 | Discharge: 2015-03-12 | Disposition: A | Discharge status: Home / Self Care | Payer: Medicaid Other | Attending: Emergency Medicine | Admitting: Emergency Medicine

## 2015-03-11 ENCOUNTER — Encounter (HOSPITAL_COMMUNITY): Payer: Self-pay | Admitting: *Deleted

## 2015-03-11 DIAGNOSIS — Z8659 Personal history of other mental and behavioral disorders: Secondary | ICD-10-CM | POA: Diagnosis not present

## 2015-03-11 DIAGNOSIS — Z7982 Long term (current) use of aspirin: Secondary | ICD-10-CM | POA: Diagnosis not present

## 2015-03-11 DIAGNOSIS — J45909 Unspecified asthma, uncomplicated: Secondary | ICD-10-CM | POA: Diagnosis not present

## 2015-03-11 DIAGNOSIS — K219 Gastro-esophageal reflux disease without esophagitis: Secondary | ICD-10-CM | POA: Diagnosis not present

## 2015-03-11 DIAGNOSIS — I252 Old myocardial infarction: Secondary | ICD-10-CM | POA: Insufficient documentation

## 2015-03-11 DIAGNOSIS — E669 Obesity, unspecified: Secondary | ICD-10-CM | POA: Insufficient documentation

## 2015-03-11 DIAGNOSIS — Y9389 Activity, other specified: Secondary | ICD-10-CM | POA: Diagnosis not present

## 2015-03-11 DIAGNOSIS — Y92410 Unspecified street and highway as the place of occurrence of the external cause: Secondary | ICD-10-CM | POA: Insufficient documentation

## 2015-03-11 DIAGNOSIS — S199XXA Unspecified injury of neck, initial encounter: Secondary | ICD-10-CM | POA: Diagnosis present

## 2015-03-11 DIAGNOSIS — S79911A Unspecified injury of right hip, initial encounter: Secondary | ICD-10-CM | POA: Diagnosis not present

## 2015-03-11 DIAGNOSIS — Y998 Other external cause status: Secondary | ICD-10-CM | POA: Diagnosis not present

## 2015-03-11 DIAGNOSIS — S161XXA Strain of muscle, fascia and tendon at neck level, initial encounter: Secondary | ICD-10-CM | POA: Diagnosis not present

## 2015-03-11 DIAGNOSIS — M1990 Unspecified osteoarthritis, unspecified site: Secondary | ICD-10-CM | POA: Diagnosis not present

## 2015-03-11 DIAGNOSIS — I10 Essential (primary) hypertension: Secondary | ICD-10-CM | POA: Diagnosis not present

## 2015-03-11 DIAGNOSIS — S39012A Strain of muscle, fascia and tendon of lower back, initial encounter: Secondary | ICD-10-CM

## 2015-03-11 DIAGNOSIS — Z79899 Other long term (current) drug therapy: Secondary | ICD-10-CM | POA: Diagnosis not present

## 2015-03-11 DIAGNOSIS — S79819A Other specified injuries of unspecified hip, initial encounter: Secondary | ICD-10-CM
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
## 2015-03-12 ENCOUNTER — Encounter (HOSPITAL_COMMUNITY): Payer: Self-pay

## 2015-03-12 ENCOUNTER — Emergency Department (HOSPITAL_COMMUNITY): Payer: Medicaid Other

## 2015-03-12 MED ORDER — HYDROCODONE-ACETAMINOPHEN 5-325 MG PO TABS: 1.0000 | ORAL_TABLET | Freq: Four times a day (QID) | ORAL | Status: DC | PRN

## 2015-03-12 MED ORDER — HYDROCODONE-ACETAMINOPHEN 5-325 MG PO TABS
1.0000 | ORAL_TABLET | Freq: Once | ORAL | Status: AC
  Administered 2015-03-12: 1 via ORAL
  Filled 2015-03-12: qty 1

## 2015-03-12 MED ORDER — METHOCARBAMOL 500 MG PO TABS
1000.0000 mg | ORAL_TABLET | Freq: Once | ORAL | Status: AC
  Administered 2015-03-12: 1000 mg via ORAL
  Filled 2015-03-12: qty 2

## 2015-03-12 MED ORDER — IBUPROFEN 400 MG PO TABS
600.0000 mg | ORAL_TABLET | Freq: Once | ORAL | Status: AC
  Administered 2015-03-12: 600 mg via ORAL
  Filled 2015-03-12 (×2): qty 1

## 2015-03-12 MED ORDER — ONDANSETRON HCL 4 MG PO TABS
4.0000 mg | ORAL_TABLET | Freq: Once | ORAL | Status: AC
  Administered 2015-03-12: 4 mg via ORAL
  Filled 2015-03-12: qty 1

## 2015-03-12 MED ORDER — IBUPROFEN 600 MG PO TABS: 600.0000 mg | ORAL_TABLET | Freq: Four times a day (QID) | ORAL | Status: DC | PRN

## 2015-03-12 MED ORDER — ONDANSETRON 4 MG PO TBDP: 4.0000 mg | ORAL_TABLET | Freq: Three times a day (TID) | ORAL | Status: AC | PRN

## 2015-03-12 MED ORDER — METHOCARBAMOL 500 MG PO TABS: 1000.0000 mg | ORAL_TABLET | Freq: Three times a day (TID) | ORAL | Status: DC | PRN
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
## 2015-03-12 NOTE — ED Notes (Signed)
Towel roll removed and c collar applied.  C/o neck pain upon palpation, lower back soreness upon palpation and right hip pain.  Moves all ext well.  Dr Lita Mains in to see

## 2015-03-12 NOTE — ED Notes (Signed)
Patient has a ride that is coming to pick her up. She verbalized understanding for follow up care. States that the pain medication is beginning to take effect.

## 2015-04-08 NOTE — Discharge Summary (Signed)
PATIENT NAME:  Whitney Boyer, Whitney Boyer MR#:  [id number] DATE OF BIRTH:  01-Jan-1966  DATE OF ADMISSION:  10/31/2012 DATE OF DISCHARGE:  11/01/2012  ADMITTING DIAGNOSIS: Right-sided shoulder pain.   DISCHARGE DIAGNOSES:  1. Right-sided shoulder pain due to acute bursitis involving the shoulder. The patient's symptoms improved. She was also seen by Orthopedics. They went ahead and injected with steroids.  2. 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 Blood pressure improved.  3. Slightly elevated troponin. Could be due malignant hypertension. No chest pain. She will be seen outpatient by cardiology for possible stress test.  4. Medication noncompliance.  5. Hypertension.  6. History of anemia.  7. Depression.   CONSULTANTS:  1. Lujean Amel, MD - Cardiology. 2. Christophe Louis, MD - Orthopedics.  LABS AND STUDIES: CT scan of the head showed no acute abnormalities.   EKG showed normal sinus rhythm without any ST-T wave changes.  Glucose was 88. Sodium 140, potassium 3.9, chloride 100, CO2 27, BUN 14, creatinine 0.72, calcium 8.5, AST 19, and ALT 22. Troponin 0.07. WBC was 9.7, hemoglobin 12.9, and platelet count 341. Subsequent. Troponin was 0.07 and 0.07.  MRI of the right shoulder showed acromial subdeltoid bursitis, partial tear over the deltoid could not be excluded.   HOSPITAL COURSE: Please refer to the History and Physical done by the admitting physician. The patient is a 49 year old African American female with history of hypertension and diabetes with medication noncompliance who presented to the ED with severe right-sided shoulder pain as well as blood pressure that was elevated in the 200s/96. She also had a troponin that was slightly elevated of 0.07. She did not have any chest pains. We are asked to admit the patient for further evaluation. The patient was kept under observation, serial cardiac enzymes were done. She had a MRI of her  shoulder which did show bursitis. She was seen by an orthopedist. They recommended outpatient PT and a steroid injection, which helped significantly with her symptoms. In terms of her blood pressure, it was under better controlled with current regimen. The patient is encouraged to be compliant with her medications. She had a slightly elevated troponin of 0.07 without any chest pain and was seen by cardiology. She will be arranged to be followed up as an outpatient for evaluation for possible stress test. At this time, she is stable for discharge.   DISCHARGE MEDICATIONS:  1. Lexapro 20 mg daily.  2. Aspirin 81 mg one tab p.o. daily.  3. Lisinopril 40 mg daily.  4. Ibuprofen 400 mg one tab p.o. every 6 hours p.r.n. pain. 5. Clonidine 0.2 mg one tab p.o. twice a day. 6. Hydrochlorothiazide 25 mg p.o. daily.   DIET: Low sodium, low fat, low cholesterol, regular consistency.   ACTIVITY: As tolerated.   DISCHARGE FOLLOWUP: Followup with primary care physician in 1 to 2 weeks, followup with Dr. Christophe Louis in 2 to 4 weeks, and Dr. Clayborn Bigness in 2 to 4 weeks.   TIME SPENT: 35 minutes.  ____________________________ Lafonda Mosses Posey Pronto, MD shp:slb D: 11/01/2012 14:10:40 ET    T: 11/01/2012 14:51:47 ET       JOB#: [id number] cc: Wolf Boulay H. Posey Pronto, MD, <Dictator> Lucas Mallow, MD Alric Seton MD ELECTRONICALLY SIGNED 11/06/2012 10:23

## 2015-04-08 NOTE — Consult Note (Signed)
After complete informed consent and under complete sterile conditions, subacromial bursa right shoulder injected with 5 ccs of Marcaine and 2 ccs of celestone. Patient tolerated procedure well. She is a candidate for oupatient PT when discharged, and we would be glad to see her in our office for this.  Electronic Signatures: Lucas Mallow (MD)  (Signed on 13-Nov-13 12:51)  Authored  Last Updated: 13-Nov-13 12:51 by Lucas Mallow (MD)

## 2015-04-08 NOTE — Consult Note (Signed)
49 year old female referred for right shoulder pain. injury, some better since hospitalization. Localized to shoulder and deltoid area. Neck non tender. exam, extremely tender rom with difficulty elevating arm.  consistent with calcific bursitis. discussed the problem thoroughly with her, and she wishes to proceed with cortisone injection in subacromial bursa. I will be by shortly after lunch today to perform the injectionl. Physical therapy would be beneficial after her pain calms down somewhat.  Electronic Signatures: Lucas Mallow (MD)  (Signed on 13-Nov-13 08:02)  Authored  Last Updated: 13-Nov-13 08:02 by Lucas Mallow (MD)

## 2015-04-08 NOTE — H&P (Signed)
PATIENT NAME:  Whitney Boyer, ALLMON MR#:  [id number] DATE OF BIRTH:  [birth year]  DATE OF ADMISSION:  10/31/2012  PRIMARY CARE PHYSICIAN: Nonlocal.  CHIEF COMPLAINT: Right shoulder pain.   HISTORY OF PRESENT ILLNESS: Patient is a 49 year old African American female with a past medical history of hypertension, anemia and depression is presenting to the ER with a chief complaint of severe right shoulder pain. Patient is reporting that for the past few days she was having cramps in the right shoulder but today she started having pain which is unbearable. The pain 10/10 with no radiation. Denies any trauma or injury. She feels warm in the right shoulder area. She does not remember lifting any heavy weights or traumatizing her shoulder. Patient came into the ER regarding her severe right shoulder pain. In the ER patient's initial blood pressure is very high at 198/103 with a pulse of 68. Subsequently it went up to 215/96. Initial troponin is elevated at 0.07. Patient was also complaining of shortness of breath and nausea. Denies any chest pain or chest pressure or tightness. No similar complaints in the past. Patient was given hydralazine 10 mg IV in the ER. Lovenox 1 mg/kg sub-Q x1 was given in the ER by ER physician. EKG did not reveal any changes. CAT scan of the head was ordered, which showed no acute findings. Hospitalist team is called to admit the patient for malignant hypertension and right shoulder pain. Patient is reporting that she is on two blood pressure medications but she takes only one medicine whenever she remembers that. She could not afford other medicine because it is expensive. Denies any headache, blurry vision or floaters. Denies any speech difficulty. No other complaints.   PAST MEDICAL HISTORY:  1. Hypertension. 2. Anemia. 3. Depression.   PAST SURGICAL HISTORY: Finger repair after she fractured  her finger.    ALLERGIES: She has no known drug allergies.   HOME MEDICATIONS:   1. Aspirin 81 mg once a day. 2. Lisinopril 10 mg p.o. once daily.  3. Lexapro 20 mg p.o. once daily.   PSYCHOSOCIAL HISTORY: Lives at home with family members. Denies smoking, alcohol or illicit drug usage.   FAMILY HISTORY: Mother had history of hypertension. Sister and brother history of hypertension. She does not know about her father's history.    REVIEW OF SYSTEMS: CONSTITUTIONAL: Patient denies any fever, fatigue, weakness, pain, weight loss or weight gain. EYES: Denies any blurry vision, redness, inflammation, black spots, cataracts. ENT: Denies any tinnitus, ear pain, hearing loss, snoring, postnasal drip, difficulty swallowing. RESPIRATORY: Denies cough, wheezing, hemoptysis, chronic obstructive pulmonary disease, tuberculosis. CARDIOVASCULAR: Denies any chest pain, orthopnea, edema, arrhythmia, varicose veins. GASTROINTESTINAL: Positive nausea. Denies vomiting, diarrhea. Denies abdominal pain, hematemesis, gastroesophageal reflux disease, irritable bowel syndrome or jaundice. GENITOURINARY: Denies dysuria, hematuria, urinary frequency or incontinence. ENDOCRINOLOGY: Denies polyuria, polyphagia, polydipsia, nocturia, thyroid problems. HEMATOLOGIC/LYMPHATIC: Chronic anemia. Denies any easy bruising, bleeding or swollen glands. INTEGUMENTARY: Denies acne, rash, lesions. MUSCULOSKELETAL: Complaining of severe right shoulder pain. Denies any neck pain, back pain or knee pain. NEUROLOGICAL: Denies any numbness, weakness, dysarthria, epilepsy, ataxia, dementia. PSYCH: Positive depression. Denies any insomnia, obsessive-compulsive disorder, schizophrenia or nervousness.   PHYSICAL EXAMINATION:  VITAL SIGNS: Initial blood pressure 198/103, presently 215/96, pulse 64, respiratory rate 18, temperature 97, sating 100%   GENERAL APPEARANCE: Is in no acute distress, answering questions appropriately, well built and well nourished.   HEENT: Normocephalic, atraumatic. Pupils are equally reacting to light  and accommodation. No scleral icterus. No conjunctival  injection. Moist mucous membranes.   NECK: Supple. No JVD. No thyromegaly. No carotid bruits.   LUNGS: Clear to auscultation bilaterally. No crackles, wheezing or rhonchi.   CARDIAC: S1, S2 normal. Regular rate and rhythm. Point of maximal impulse is intact. No thrills. No murmurs.   GASTROINTESTINAL: Soft. Bowel sounds are positive in all four quadrants. Nontender, nondistended.  NEUROLOGICAL: Awake, alert and oriented x3. Cranial nerves II through XII are grossly intact. Motor and sensory is intact.   EXTREMITIES: No edema. No cyanosis. No clubbing.   SKIN: No rashes. No lesions. No jaundice.   MUSCULOSKELETAL: Right shoulder is tender and warm to touch. Range of motion is limited secondary to severe tenderness. Left shoulder is intact.   LABORATORY, DIAGNOSTIC AND RADIOLOGICAL DATA: CT head no acute findings. 12-lead EKG has revealed normal sinus rhythm at 79 beats per minute, normal PR and QRS intervals, nonspecific T wave abnormality. Glucose 88, sodium 140, potassium 3.9, chloride 100, CO2 27, BUN 14, creatinine 0.72, calcium 8.5, total protein 7.8, serum albumin 3.3, total bilirubin 0.4, alkaline phosphatase 114, AST 19, ALT 22, troponin T 0.07, WBC 9.7, hemoglobin 12.9, hematocrit 38.0, platelet count 341,000.   ASSESSMENT AND PLAN: 48 year old African American female presenting to the ER with a chief complaint of right shoulder pain. Will be admitted with the following assessment and plan:  1. Malignant hypertension. Probably from noncompliance with her medications. Start patient on metoprolol and increase her home medication lisinopril from 10 mg to 40 mg p.o. once daily. CT head is negative. Reinforce the importance of being compliant with her medications. Indeterminate troponin can be probably from malignant hypertension. Cycle cardiac biomarkers. ACS protocol with oxygen, nitroglycerin, aspirin, beta blocker and statin is  initiated. Lovenox 1 mg/kg sub-Q x1 was given in the ER. Will monitor troponins closely and if they are still trending up will continue Lovenox sub-Q q.12 hours and consider cardiology consult.  2. Severe right shoulder pain. Probably from bursitis. Will provide her morphine 2 mg IV q.4 hours as needed for pain. Will get right shoulder x-ray. Ortho consult is placed. Will consider PT consult if patient is clinically stable.  3. Chronic history of anemia. Hemoglobin is 12.9 at the time of admission and MCV is 84. No interventions are needed at this time.  4. Chronic history of depression. Patient is stable. Continue her home medication, Lexapro, and during his hospital course it will substituted with Celexa. Denies any suicidal or homicidal ideation.   5. Will provide GI and deep vein thrombosis prophylaxis.  6. FULL CODE.   TOTAL TIME SPENT ON THE ADMISSION: 60 minutes.   The diagnosis and plan of care was discussed in detail with the patient. She is aware of the plan.   ____________________________ Nicholes Mango, MD ag:cms D: 10/31/2012 01:42:34 ET T: 10/31/2012 07:51:52 ET JOB#: [id number]  cc: Nicholes Mango, MD, <Dictator> Laurice Record. Holley Bouche., MD Nicholes Mango MD ELECTRONICALLY SIGNED 11/11/2012 0:50

## 2015-04-08 NOTE — Consult Note (Signed)
PATIENT NAME:  Whitney, Boyer MR#:  [id number] DATE OF BIRTH:  February 04, 1966  DATE OF CONSULTATION:  10/31/2012  REFERRING PHYSICIAN:  Prime Doc  CONSULTING PHYSICIAN:  Mellody Masri D. Meilyn Heindl, MD  INDICATION: Chest pain, angina, right arm pain.   HISTORY OF PRESENT ILLNESS: Ms. Whitney Boyer is a 49 year old African American female with past medical history of hypertension, anemia, and depression who presented to the Emergency Room with right shoulder pain worse with movement. [name] had some cramps in her right arm and shoulder. Pain has been 10 out of 10. Denies any significant trauma. She came to the Emergency Room with elevated blood pressure, systolic of 295, was treated with Lovenox and hydralazine for blood pressure. CAT scan was ordered which was unremarkable. The patient was subsequently admitted for possible angina and chest pain.   REVIEW OF SYSTEMS: No blackout spells, syncope. No nausea, vomiting. No fever. No chills. No sweats. No weight loss. No weight gain. No hemoptysis. No hematemesis. No bright red blood per rectum.   PAST MEDICAL HISTORY:  1. Hypertension. 2. Anemia. 3. Depression.   PAST SURGICAL HISTORY: Finger surgery.   ALLERGIES: None.   MEDICATIONS:  1. Aspirin 81 mg a day.  2. Lisinopril 10 a day.  3. Lexapro 20.  SOCIAL HISTORY: Lives at home. No smoking or alcohol consumption.   FAMILY HISTORY: Hypertension.   PHYSICAL EXAMINATION:   VITAL SIGNS: Blood pressure 215/100, pulse 65, respiratory rate 18, afebrile.   HEENT: Normocephalic, atraumatic. Pupils equal, reactive to light.   NECK: Supple. No significant JVD, bruits, or adenopathy.   LUNGS: Clear to auscultation and percussion. No significant wheeze, rhonchi, or rales.   HEART: Regular rate and rhythm.   ABDOMEN: Positive bowel sounds. No rebound, guarding, or tenderness.  EXTREMITIES: Within normal limits. No cyanosis, clubbing, or edema.   NEUROLOGIC: Intact.   SKIN: Normal.   LABORATORIES: CT  was unremarkable.   EKG normal sinus rhythm, nonspecific ST-T wave changes.   MET-B unremarkable. LFTs are negative CBC unremarkable.   ASSESSMENT:  1. Malignant hypertension. 2. Possible unstable angina.  3. Severe shoulder discomfort.  4. Chronic anemia.  5. Depression.   PLAN:  1. Agree with admit.  2. Rule out for myocardial infarction.  3. Treat malignant hypertension aggressively.  4. Also will consider further evaluation for possible anginal symptoms with the right shoulder discomfort which may be related to DJD but referred cardiac pain cannot be completely ruled out.  5. Continue treatment for depression.  6. Follow-up anemia therapy.  7. Base further evaluation on the results of her recurrent symptoms.   ____________________________ Loran Senters Clayborn Bigness, MD ddc:drc D: 10/31/2012 23:04:39 ET T: 11/01/2012 09:19:56 ET JOB#: [id number]  cc: Cristo Ausburn D. Clayborn Bigness, MD, <Dictator> Yolonda Kida MD ELECTRONICALLY SIGNED 12/05/2012 14:48

## 2015-06-09 ENCOUNTER — Emergency Department
Admission: EM | Admit: 2015-06-09 | Discharge: 2015-06-09 | Disposition: A | Discharge status: Home / Self Care | Payer: Medicaid Other | Attending: Emergency Medicine | Admitting: Emergency Medicine

## 2015-06-09 ENCOUNTER — Emergency Department: Payer: Medicaid Other

## 2015-06-09 ENCOUNTER — Encounter: Payer: Self-pay | Admitting: Emergency Medicine

## 2015-06-09 DIAGNOSIS — Z7982 Long term (current) use of aspirin: Secondary | ICD-10-CM | POA: Insufficient documentation

## 2015-06-09 DIAGNOSIS — Y998 Other external cause status: Secondary | ICD-10-CM | POA: Insufficient documentation

## 2015-06-09 DIAGNOSIS — S161XXA Strain of muscle, fascia and tendon at neck level, initial encounter: Secondary | ICD-10-CM

## 2015-06-09 DIAGNOSIS — I10 Essential (primary) hypertension: Secondary | ICD-10-CM | POA: Insufficient documentation

## 2015-06-09 DIAGNOSIS — Z79899 Other long term (current) drug therapy: Secondary | ICD-10-CM | POA: Insufficient documentation

## 2015-06-09 DIAGNOSIS — Y9289 Other specified places as the place of occurrence of the external cause: Secondary | ICD-10-CM | POA: Insufficient documentation

## 2015-06-09 DIAGNOSIS — Y9389 Activity, other specified: Secondary | ICD-10-CM | POA: Insufficient documentation

## 2015-06-09 DIAGNOSIS — X58XXXA Exposure to other specified factors, initial encounter: Secondary | ICD-10-CM | POA: Insufficient documentation

## 2015-06-09 MED ORDER — ORPHENADRINE CITRATE 30 MG/ML IJ SOLN
60.0000 mg | Freq: Two times a day (BID) | INTRAMUSCULAR | Status: DC
  Administered 2015-06-09: 60 mg via INTRAMUSCULAR

## 2015-06-09 MED ORDER — OXYCODONE-ACETAMINOPHEN 5-325 MG PO TABS
ORAL_TABLET | ORAL | Status: AC
  Administered 2015-06-09: 1 via ORAL
  Filled 2015-06-09: qty 1

## 2015-06-09 MED ORDER — KETOROLAC TROMETHAMINE 60 MG/2ML IM SOLN
INTRAMUSCULAR | Status: AC
  Administered 2015-06-09: 60 mg via INTRAMUSCULAR
  Filled 2015-06-09: qty 2

## 2015-06-09 MED ORDER — OXYCODONE-ACETAMINOPHEN 5-325 MG PO TABS
1.0000 | ORAL_TABLET | Freq: Once | ORAL | Status: AC
  Administered 2015-06-09: 1 via ORAL

## 2015-06-09 MED ORDER — ORPHENADRINE CITRATE 30 MG/ML IJ SOLN
INTRAMUSCULAR | Status: AC
  Administered 2015-06-09: 60 mg via INTRAMUSCULAR
  Filled 2015-06-09: qty 2

## 2015-06-09 MED ORDER — KETOROLAC TROMETHAMINE 60 MG/2ML IM SOLN
60.0000 mg | Freq: Once | INTRAMUSCULAR | Status: AC
  Administered 2015-06-09: 60 mg via INTRAMUSCULAR

## 2015-06-09 NOTE — Discharge Instructions (Signed)
Cervical Sprain A cervical sprain is when the tissues (ligaments) that hold the neck bones in place stretch or tear. HOME CARE   Put ice on the injured area.  Put ice in a plastic bag.  Place a towel between your skin and the bag.  Leave the ice on for 15-20 minutes, 3-4 times a day.  You may have been given a collar to wear. This collar keeps your neck from moving while you heal.  Do not take the collar off unless told by your doctor.  If you have long hair, keep it outside of the collar.  Ask your doctor before changing the position of your collar. You may need to change its position over time to make it more comfortable.  If you are allowed to take off the collar for cleaning or bathing, follow your doctor's instructions on how to do it safely.  Keep your collar clean by wiping it with mild soap and water. Dry it completely. If the collar has removable pads, remove them every 1-2 days to hand wash them with soap and water. Allow them to air dry. They should be dry before you wear them in the collar.  Do not drive while wearing the collar.  Only take medicine as told by your doctor.  Keep all doctor visits as told.  Keep all physical therapy visits as told.  Adjust your work station so that you have good posture while you work.  Avoid positions and activities that make your problems worse.  Warm up and stretch before being active. GET HELP IF:  Your pain is not controlled with medicine.  You cannot take less pain medicine over time as planned.  Your activity level does not improve as expected. GET HELP RIGHT AWAY IF:   You are bleeding.  Your stomach is upset.  You have an allergic reaction to your medicine.  You develop new problems that you cannot explain.  You lose feeling (become numb) or you cannot move any part of your body (paralysis).  You have tingling or weakness in any part of your body.  Your symptoms get worse. Symptoms include:  Pain,  soreness, stiffness, puffiness (swelling), or a burning feeling in your neck.  Pain when your neck is touched.  Shoulder or upper back pain.  Limited ability to move your neck.  Headache.  Dizziness.  Your hands or arms feel week, lose feeling, or tingle.  Muscle spasms.  Difficulty swallowing or chewing. MAKE SURE YOU:   Understand these instructions.  Will watch your condition.  Will get help right away if you are not doing well or get worse. Document Released: 05/24/2008 Document Revised: 08/08/2013 Document Reviewed: 06/13/2013 Exit[employer] Patient Information 2015 [employer]. This information is not intended to replace advice given to you by your health care provider. Make sure you discuss any questions you have with your health care provider.  

## 2015-06-09 NOTE — ED Provider Notes (Signed)
[facility] Emergency Department Provider Note  ____________________________________________  Time seen: Approximately 7:21 AM  I have reviewed the triage vital signs and the nursing notes.   HISTORY  Chief Complaint Neck Pain and Sore Throat    HPI Whitney Boyer is a 49 y.o. female acute onset of neck pain last night. Patient stated onset was while driving home. . Patient states unrelieved with narcotic and muscle relaxants. Patient states she is hardly able to move the neck in any range. Patient denies any radicular component to her neck pain. States has any loss of sensation or movements to the upper extremities.   Past Medical History  Diagnosis Date  . Arthritis   . Asthma   . Hypertension   . Obesity   . GERD (gastroesophageal reflux disease)   . Myocardial infarction 10/2012  . Depression     Patient Active Problem List   Diagnosis Date Noted  . BURSITIS, RIGHT HIP 06/30/2010  . CHEST WALL PAIN, ACUTE 06/30/2010  . SINUSITIS, ACUTE 05/28/2010  . FIBROIDS, UTERUS 10/27/2009  . ABNORMAL VAGINAL BLEEDING 12/10/2008  . VAGINAL DISCHARGE 10/01/2008  . DYSLIPIDEMIA 07/19/2008  . ANEMIA, IRON DEFICIENCY 06/02/2008  . ANXIETY DEPRESSION 05/10/2008  . HYPERTENSION 05/10/2008  . CHEST PAIN 05/10/2008  . DEGENERATIVE JOINT DISEASE, HIPS 11/30/2007  . DEGENERATIVE JOINT DISEASE, KNEES, BILATERAL 11/30/2007  . LATERAL EPICONDYLITIS, RIGHT 11/30/2007  . SNORING 11/30/2007  . OBESITY 09/05/2007  . GERD 09/05/2007  . PELVIC INFLAMMATORY DISEASE 09/05/2007    Past Surgical History  Procedure Laterality Date  . Fracture surgery  2004    broken finger repair  . Tubal ligation  1999    Current Outpatient Rx  Name  Route  Sig  Dispense  Refill  . albuterol (PROVENTIL HFA;VENTOLIN HFA) 108 (90 BASE) MCG/ACT inhaler   Inhalation   Inhale 2 puffs into the lungs every 6 (six) hours as needed. For shortness of breath/wheezing         .  aspirin 81 MG chewable tablet   Oral   Chew 81 mg by mouth daily.          . chlorthalidone (HYGROTON) 25 MG tablet   Oral   Take 25 mg by mouth daily.          Marland Kitchen HYDROcodone-acetaminophen (NORCO/VICODIN) 5-325 MG per tablet   Oral   Take 1-2 tablets by mouth every 6 (six) hours as needed for moderate pain or severe pain.   15 tablet   0   . methocarbamol (ROBAXIN) 500 MG tablet   Oral   Take 2 tablets (1,000 mg total) by mouth every 8 (eight) hours as needed.   30 tablet   0   . metoprolol tartrate (LOPRESSOR) 25 MG tablet   Oral   Take 25 mg by mouth 2 (two) times daily.         Marland Kitchen omeprazole (PRILOSEC) 20 MG capsule   Oral   Take 20 mg by mouth daily.         . ondansetron (ZOFRAN ODT) 4 MG disintegrating tablet   Oral   Take 1 tablet (4 mg total) by mouth every 8 (eight) hours as needed for nausea.   10 tablet   0   . Benzocaine 10 MG LOZG   Mouth/Throat   Use as directed 1 lozenge (10 mg total) in the mouth or throat every 8 (eight) hours as needed. Patient not taking: Reported on 12/14/2014   4 lozenge   0   .  ibuprofen (ADVIL,MOTRIN) 600 MG tablet   Oral   Take 1 tablet (600 mg total) by mouth every 6 (six) hours as needed.   30 tablet   0     Allergies Doxycycline and Morphine and related  History reviewed. No pertinent family history.  Social History History  Substance Use Topics  . Smoking status: Never Smoker   . Smokeless tobacco: Never Used  . Alcohol Use: No    Review of Systems Constitutional: No fever/chills Eyes: No visual changes. ENT: Sore throat and neck pain Cardiovascular: Denies chest pain. Respiratory: Denies shortness of breath. Gastrointestinal: No abdominal pain.  No nausea, no vomiting.  No diarrhea.  No constipation. Genitourinary: Negative for dysuria. Musculoskeletal: Pain Skin: Negative for rash. Neurological: Negative for headaches, focal weakness or numbness. 10-point ROS otherwise  negative.  ____________________________________________   PHYSICAL EXAM:  VITAL SIGNS: ED Triage Vitals  Enc Vitals Group     BP 06/09/15 0655 138/93 mmHg     Pulse Rate 06/09/15 0655 84     Resp 06/09/15 0655 24     Temp 06/09/15 0655 98 F (36.7 C)     Temp Source 06/09/15 0655 Oral     SpO2 06/09/15 0655 98 %     Weight --      Height --      Head Cir --      Peak Flow --      Pain Score 06/09/15 0656 10     Pain Loc --      Pain Edu? --      Excl. in [place]? --     Constitutional: Alert and oriented. Well appearing and in no acute distress. Eyes: Conjunctivae are normal. PERRL. EOMI. Head: Atraumatic. Nose: No congestion/rhinnorhea. Mouth/Throat: Mucous membranes are moist.  Oropharynx non-erythematous. Neck: No stridor. No deformity decreased range of motion's all fields tender palpation left lateral neck. Hematological/Lymphatic/Immunilogical: No cervical lymphadenopathy. Cardiovascular: Normal rate, regular rhythm. Grossly normal heart sounds.  Good peripheral circulation. Respiratory: Normal respiratory effort.  No retractions. Lungs CTAB. Gastrointestinal: Soft and nontender. No distention. No abdominal bruits. No CVA tenderness. Musculoskeletal: No lower extremity tenderness nor edema.  No joint effusions. Neurologic:  Normal speech and language. No gross focal neurologic deficits are appreciated. Speech is normal. No gait instability. Skin:  Skin is warm, dry and intact. No rash noted. Psychiatric: Mood and affect are normal. Speech and behavior are normal.  ____________________________________________   LABS (all labs ordered are listed, but only abnormal results are displayed)  Labs Reviewed - No data to display ____________________________________________  EKG   ____________________________________________  RADIOLOGY  C-spine x-ray shows moderate DJD but no other acute  findings. ____________________________________________   PROCEDURES  Procedure(s) performed: None  Critical Care performed: No  ____________________________________________   INITIAL IMPRESSION / ASSESSMENT AND PLAN / ED COURSE  Pertinent labs & imaging results that were available during my care of the patient were reviewed by me and considered in my medical decision making (see chart for details).  Cervical strain ____________________________________________   FINAL CLINICAL IMPRESSION(S) / ED DIAGNOSES  Final diagnoses:  Cervical strain, acute, initial encounter      Sable Feil, PA-C 06/09/15 1975  Carrie Mew, MD 06/09/15 [phone]

## 2015-06-09 NOTE — ED Notes (Signed)
Pt states left neck pain since yesterday. Pt tearful in triage. Pt states pain with movement. Pt also states sore throat.

## 2015-07-06 DIAGNOSIS — M79621 Pain in right upper arm: Secondary | ICD-10-CM | POA: Insufficient documentation

## 2015-07-06 DIAGNOSIS — I252 Old myocardial infarction: Secondary | ICD-10-CM | POA: Insufficient documentation

## 2015-07-06 DIAGNOSIS — Z8659 Personal history of other mental and behavioral disorders: Secondary | ICD-10-CM | POA: Insufficient documentation

## 2015-07-06 DIAGNOSIS — Z8719 Personal history of other diseases of the digestive system: Secondary | ICD-10-CM | POA: Insufficient documentation

## 2015-07-06 DIAGNOSIS — M7501 Adhesive capsulitis of right shoulder: Secondary | ICD-10-CM | POA: Insufficient documentation

## 2015-07-06 DIAGNOSIS — E669 Obesity, unspecified: Secondary | ICD-10-CM | POA: Insufficient documentation

## 2015-07-06 DIAGNOSIS — J45909 Unspecified asthma, uncomplicated: Secondary | ICD-10-CM | POA: Insufficient documentation

## 2015-07-06 DIAGNOSIS — I10 Essential (primary) hypertension: Secondary | ICD-10-CM | POA: Insufficient documentation

## 2015-07-07 ENCOUNTER — Encounter (HOSPITAL_COMMUNITY): Payer: Self-pay | Admitting: Emergency Medicine

## 2015-07-07 ENCOUNTER — Emergency Department (HOSPITAL_COMMUNITY)
Admission: EM | Admit: 2015-07-07 | Discharge: 2015-07-07 | Disposition: A | Discharge status: Home / Self Care | Payer: Medicaid Other | Attending: Emergency Medicine | Admitting: Emergency Medicine

## 2015-07-07 ENCOUNTER — Emergency Department (HOSPITAL_COMMUNITY): Payer: Medicaid Other

## 2015-07-07 DIAGNOSIS — M25511 Pain in right shoulder: Secondary | ICD-10-CM

## 2015-07-07 DIAGNOSIS — M7501 Adhesive capsulitis of right shoulder: Secondary | ICD-10-CM

## 2015-07-07 MED ORDER — HYDROCODONE-ACETAMINOPHEN 5-325 MG PO TABS: 1.0000 | ORAL_TABLET | Freq: Four times a day (QID) | ORAL | Status: DC | PRN

## 2015-07-07 MED ORDER — HYDROCODONE-ACETAMINOPHEN 5-325 MG PO TABS
2.0000 | ORAL_TABLET | Freq: Once | ORAL | Status: AC
  Administered 2015-07-07: 2 via ORAL
  Filled 2015-07-07: qty 2

## 2015-07-07 MED ORDER — KETOROLAC TROMETHAMINE 60 MG/2ML IM SOLN
60.0000 mg | Freq: Once | INTRAMUSCULAR | Status: AC
  Administered 2015-07-07: 60 mg via INTRAMUSCULAR
  Filled 2015-07-07: qty 2

## 2015-07-07 MED ORDER — DIAZEPAM 5 MG PO TABS: 5.0000 mg | ORAL_TABLET | Freq: Two times a day (BID) | ORAL | Status: DC

## 2015-07-07 MED ORDER — DIAZEPAM 5 MG PO TABS
5.0000 mg | ORAL_TABLET | Freq: Once | ORAL | Status: AC
Start: 2015-07-07 — End: 2015-07-07
  Administered 2015-07-07: 5 mg via ORAL
  Filled 2015-07-07: qty 1

## 2015-07-07 MED ORDER — DICLOFENAC SODIUM 1 % TD GEL: 2.0000 g | Freq: Four times a day (QID) | TRANSDERMAL | Status: AC

## 2015-07-07 NOTE — ED Notes (Signed)
In room to medicate patient.  OTF at this time.

## 2015-07-07 NOTE — Discharge Instructions (Signed)
Use diclofenac gel and Valium as prescribed. Be sure to stretch your arm multiple times per day to prevent worsening symptoms. You may take Norco as needed for severe pain. Follow-up with your primary care doctor for a recheck of symptoms.  Adhesive Capsulitis Sometimes the shoulder becomes stiff and is painful to move. Some people say it feels as if the shoulder is frozen in place. Because of this, the condition is called "frozen shoulder." Its medical name is adhesive capsulitis.  The shoulder joint is made up of strong connective tissue that attaches the ball of the humerus to the shallow shoulder socket. This strong connective tissue is called the joint capsule. This tissue can become stiff and swollen. That is when adhesive capsulitis sets in. CAUSES  It is not always clear just what the cause adhesive capsulitis. Possibilities include:  Injury to the shoulder joint.  Strain. This is a repetitive injury brought about by overuse.  Lack of use. Perhaps your arm or hand was otherwise injured. It might have been in a sling for awhile. Or perhaps you were not using it to avoid pain.  Referred pain. This is a sort of trick the body plays. You feel pain in the shoulder. But, the pain actually comes from an injury somewhere else in the body.  Long-standing health problems. Several diseases can cause adhesive capsulitis. They include diabetes, heart disease, stroke, thyroid problems, rheumatoid arthritis and lung disease.  Being a women older than 36. Anyone can develop adhesive capsulitis but it is most common in women in this age group. SYMPTOMS   Pain.  It occurs when the arm is moved.  Parts of the shoulder might hurt if they are touched.  Pain is worse at night or when resting.  Soreness. It might not be strong enough to be called pain. But, the shoulder aches.  The shoulder does not move freely.  Muscle spasms.  Trouble sleeping because of shoulder ache or pain. DIAGNOSIS  To  decide if you have adhesive capsulitis, your healthcare provider will probably:  Ask about symptoms you have noticed.  Ask about your history of joint pain and anything that might have caused the pain.  Ask about your overall health.  Use hands to feel your shoulder and neck.  Ask you to move your shoulder in specific directions. This may indicate the origin of the pain.  Order imaging tests; pictures of the shoulder. They help pinpoint the source of the problem. An X-ray might be used. For more detail, an MRI is often used. An MRI details the tendons, muscles and ligaments as well as the joint. TREATMENT  Adhesive capsulitis can be treated several ways. Most treatments can be done in a clinic or in your healthcare provider's office. Be sure to discuss the different options with your caregiver. They include:  Physical therapy. You will work on specific exercises to get your shoulder moving again. The exercises usually involve stretching. A physical therapist (a caregiver with special training) can show you what to do and what not to do. The exercises will need to be done daily.  Medication.  Over-the-counter medicines may relieve pain and inflammation (the body's way of reacting to injury or infection).  Corticosteroids. These are stronger drugs to reduce pain and inflammation. They are given by injection (shots) into the shoulder joint. Frequent treatment is not recommended.  Muscle relaxants. Medication may be prescribed to ease muscle spasms.  Treatment of underlying conditions. This means treating another condition that is causing your  shoulder problem. This might be a rotator cuff (tendon) problem  Shoulder manipulation. The shoulder will be moved by your healthcare provider. You would be under general anesthesia (given a drug that puts you to sleep). You would not feel anything. Sometimes the joint will be injected with salt water (saline) at high pressure to break down internal  scarring in the joint capsule.  Surgery. This is rarely needed. It may be suggested in advanced cases after all other treatment has failed. PROGNOSIS  In time, most people recover from adhesive capsulitis. Sometimes, however, the pain goes away but full movement of the shoulder does not return.  HOME CARE INSTRUCTIONS   Take any pain medications recommended by your healthcare provider. Follow the directions carefully.  If you have physical therapy, follow through with the therapist's suggestions. Be sure you understand the exercises you will be doing. You should understand:  How often the exercises should be done.  How many times each exercise should be repeated.  How long they should be done.  What other activities you should do, or not do.  That you should warm up before doing any exercise. Just 5 to 10 minutes will help. Small, gentle movements should get your shoulder ready for more.  Avoid high-demand exercise that involves your shoulder such as throwing. This type of exercise can make pain worse.  Consider using cold packs. Cold may ease swelling and pain. Ask your healthcare provider if a cold pack might help you. If so, get directions on how and when to use them. SEEK MEDICAL CARE IF:   You have any questions about your medications.  Your pain continues to increase. Document Released: 10/03/2009 Document Revised: 02/28/2012 Document Reviewed: 10/03/2009 [facility] Patient Information 2015 [place], [place]. This information is not intended to replace advice given to you by your health care provider. Make sure you discuss any questions you have with your health care provider.

## 2015-07-07 NOTE — ED Notes (Addendum)
Patient presents to ED with reports of severe, constant right arm pain which began yesterday morning when she awakened. Denies fall or injury to arm. Reports she had taken Tylenol earlier today with no relief.

## 2015-07-07 NOTE — ED Provider Notes (Signed)
CSN: [id number]     Arrival date & time 07/06/15  2350 History   First MD Initiated Contact with Patient 07/07/15 0015     Chief Complaint  Patient presents with  . Arm Pain     (Consider location/radiation/quality/duration/timing/severity/associated sxs/prior Treatment) HPI Comments: 49 year old female presents to the emergency department for further evaluation of constant, right upper arm pain which began yesterday. Patient describes the pain as sharp and severe, radiating intermittently down her right arm with movement of her right shoulder. Patient states that she has been trying to apply a hot rag and take Tylenol for symptoms, but this has provided her no relief. She denies any trauma or injury to the area. She does have a history of similar symptoms in the past. She cannot recall what medications worked for her to improve her pain. Patient denies any associated fever, chest pain, shortness of breath, extremity numbness, or extremity weakness.  Patient is a 49 y.o. female presenting with arm pain. The history is provided by the patient. No language interpreter was used.  Arm Pain Associated symptoms include arthralgias and myalgias.    Past Medical History  Diagnosis Date  . Arthritis   . Asthma   . Hypertension   . Obesity   . GERD (gastroesophageal reflux disease)   . Myocardial infarction 10/2012  . Depression    Past Surgical History  Procedure Laterality Date  . Fracture surgery  2004    broken finger repair  . Tubal ligation  1999   No family history on file. History  Substance Use Topics  . Smoking status: Never Smoker   . Smokeless tobacco: Never Used  . Alcohol Use: No   OB History    Gravida Para Term Preterm AB TAB SAB Ectopic Multiple Living   2 2        2       Review of Systems  Musculoskeletal: Positive for myalgias and arthralgias.  All other systems reviewed and are negative.   Allergies  Doxycycline and Morphine and related  Home  Medications   Prior to Admission medications   Medication Sig Start Date End Date Taking? Authorizing Provider  Benzocaine 10 MG LOZG Use as directed 1 lozenge (10 mg total) in the mouth or throat every 8 (eight) hours as needed. Patient not taking: Reported on 12/14/2014 05/21/14   Leo Grosser, MD  diazepam (VALIUM) 5 MG tablet Take 1 tablet (5 mg total) by mouth 2 (two) times daily. 07/07/15   Antonietta Breach, PA-C  diclofenac sodium (VOLTAREN) 1 % GEL Apply 2 g topically 4 (four) times daily. 07/07/15   Antonietta Breach, PA-C  HYDROcodone-acetaminophen (NORCO/VICODIN) 5-325 MG per tablet Take 1-2 tablets by mouth every 6 (six) hours as needed. 07/07/15   Antonietta Breach, PA-C  ibuprofen (ADVIL,MOTRIN) 600 MG tablet Take 1 tablet (600 mg total) by mouth every 6 (six) hours as needed. Patient not taking: Reported on 07/07/2015 03/12/15   Julianne Rice, MD  methocarbamol (ROBAXIN) 500 MG tablet Take 2 tablets (1,000 mg total) by mouth every 8 (eight) hours as needed. Patient not taking: Reported on 07/07/2015 03/12/15   Julianne Rice, MD  ondansetron (ZOFRAN ODT) 4 MG disintegrating tablet Take 1 tablet (4 mg total) by mouth every 8 (eight) hours as needed for nausea. Patient not taking: Reported on 07/07/2015 03/12/15   Julianne Rice, MD   BP 125/99 mmHg  Pulse 84  Temp(Src) 98.7 F (37.1 C) (Oral)  Resp 24  Ht 5' (1.524 m)  Wt  260 lb (117.935 kg)  BMI 50.78 kg/m2  SpO2 100%  LMP 11/04/2011   Physical Exam  Constitutional: She is oriented to person, place, and time. She appears well-developed and well-nourished. No distress.  Nontoxic/nonseptic appearing  HENT:  Head: Normocephalic and atraumatic.  Eyes: Conjunctivae and EOM are normal. No scleral icterus.  Neck: Normal range of motion.  Cardiovascular: Normal rate, regular rhythm and intact distal pulses.   Pulses:      Radial pulses are 2+ on the right side.  Pulmonary/Chest: Effort normal. No respiratory distress.  Respirations even and  unlabored  Musculoskeletal:       Right shoulder: She exhibits decreased range of motion, tenderness and pain. She exhibits no bony tenderness, no swelling, no crepitus, no deformity, normal pulse and normal strength.       Right elbow: She exhibits normal range of motion, no swelling, no effusion, no deformity and no laceration. No tenderness found.       Cervical back: Normal.       Right upper arm: She exhibits tenderness. She exhibits no bony tenderness, no swelling, no edema and no deformity.       Right forearm: Normal.  TTP about the R shoulder without deformity or crepitus. No swelling or erythema. No heat to touch. Limited active and passive movement with abduction of the R shoulder secondary to pain. Normal ROM of the R elbow appreciated.  Neurological: She is alert and oriented to person, place, and time. She exhibits normal muscle tone. Coordination normal.  Sensation to light touch intact in the right upper extremity. 5/5 grip strength appreciated as well as normal strength against resistance in the major muscle groups of the right upper extremity.  Skin: Skin is warm and dry. No rash noted. She is not diaphoretic. No erythema. No pallor.  Psychiatric: She has a normal mood and affect. Her behavior is normal.  Nursing note and vitals reviewed.   ED Course  Procedures (including critical care time) Labs Review Labs Reviewed - No data to display  Imaging Review Dg Shoulder Right  07/07/2015   CLINICAL DATA:  49 year old female with no known injury. Right shoulder pain. Decreased range of motion.  EXAM: RIGHT SHOULDER - 2+ VIEW  COMPARISON:  None.  FINDINGS: There is no evidence of fracture or dislocation. There is no evidence of arthropathy or other focal bone abnormality. Soft tissues are unremarkable.  IMPRESSION: No acute osseous pathology.   Electronically Signed   By: Anner Crete M.D.   On: 07/07/2015 01:01     EKG Interpretation None      MDM   Final diagnoses:   Shoulder pain, acute, right  Adhesive capsulitis, right    49 year old female presents to the emergency department for further evaluation of right arm pain. Symptoms and physical exam consistent with adhesive capsulitis. Patient is neurovascularly intact. X-ray negative for bony changes. No concern for septic joint. Symptoms to be managed supportively as outpatient with NSAIDs, muscle relaxers, and pain medicine. Have stressed the importance of stretching to prevent worsening symptoms. Return precautions discussed and provided. Patient agreeable to plan with no unaddressed concerns. Patient discharged in good condition.   Filed Vitals:   07/07/15 0100 07/07/15 0136 07/07/15 0153 07/07/15 0156  BP: 125/99 125/99  138/82  Pulse: 72 84  77  Temp:   98.7 F (37.1 C)   TempSrc:   Oral   Resp:    16  Height:      Weight:  SpO2: 100% 100%  100%     Antonietta Breach, PA-C 07/07/15 0158  Linton Flemings, MD 07/07/15 [phone]

## 2016-03-20 IMAGING — CR DG CERVICAL SPINE COMPLETE 4+V
6 series · 7 of 7 positions shown · non-contrast
Comparison: Cervical spine CT 03/12/2015

CLINICAL DATA: Left-sided neck pain since yesterday. Sore throat.
Pain with movement.

EXAM:
CERVICAL SPINE  4+ VIEWS

[c-spine lat]
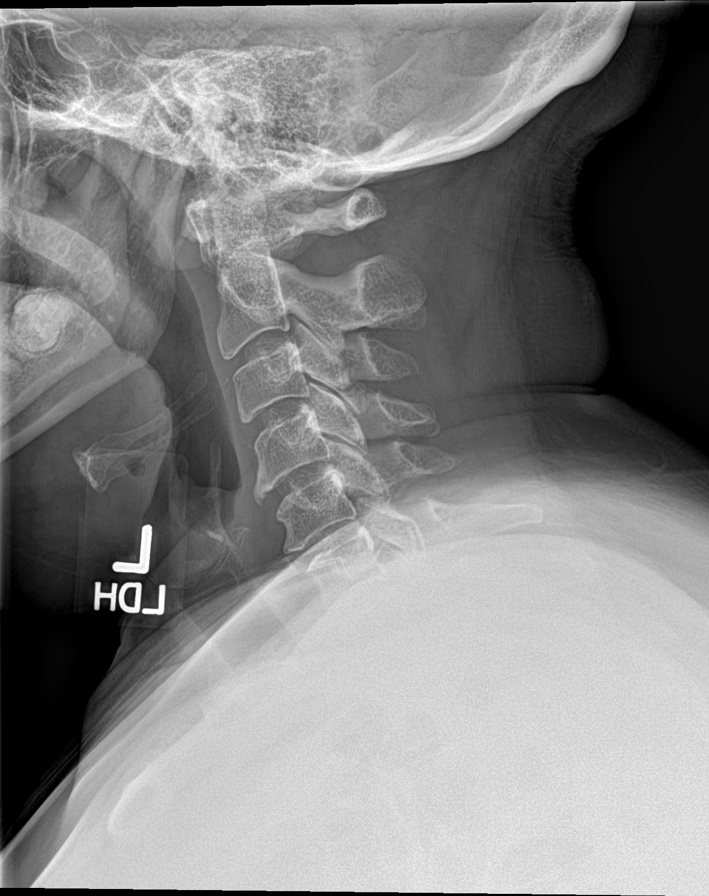

[c-spine obl (1 of 2)]
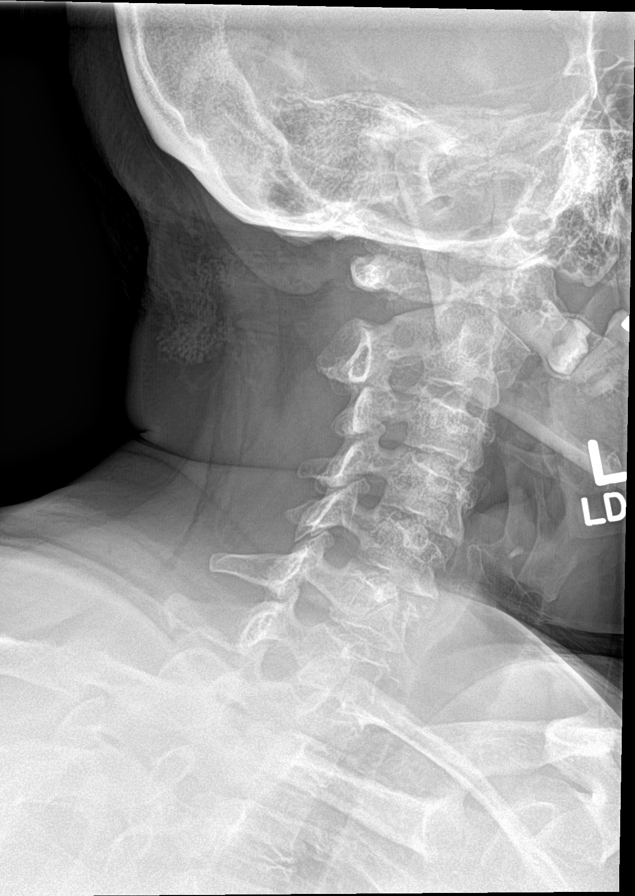

[c-spine obl (2 of 2)]
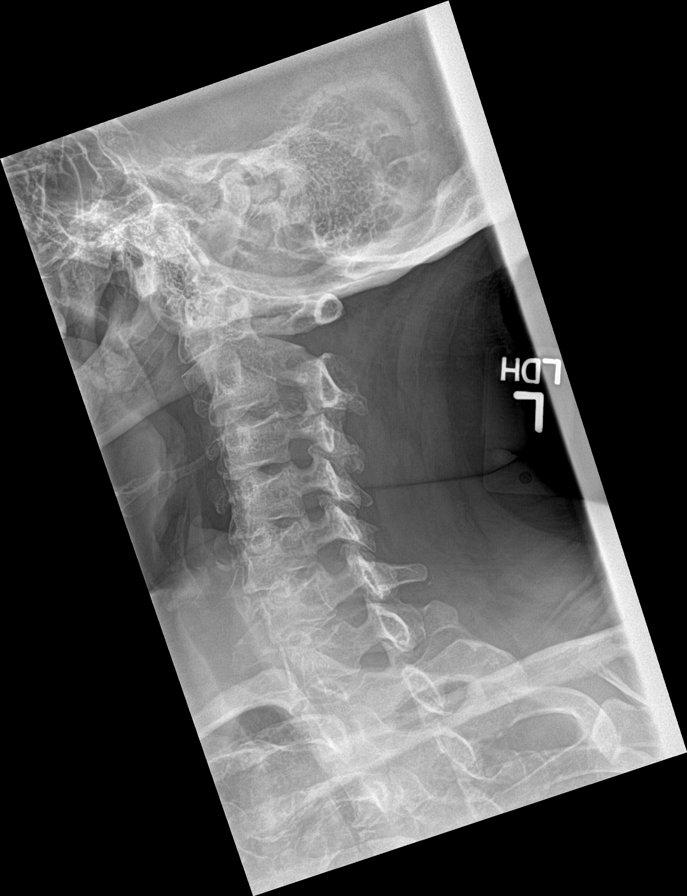

[c-spine ap]
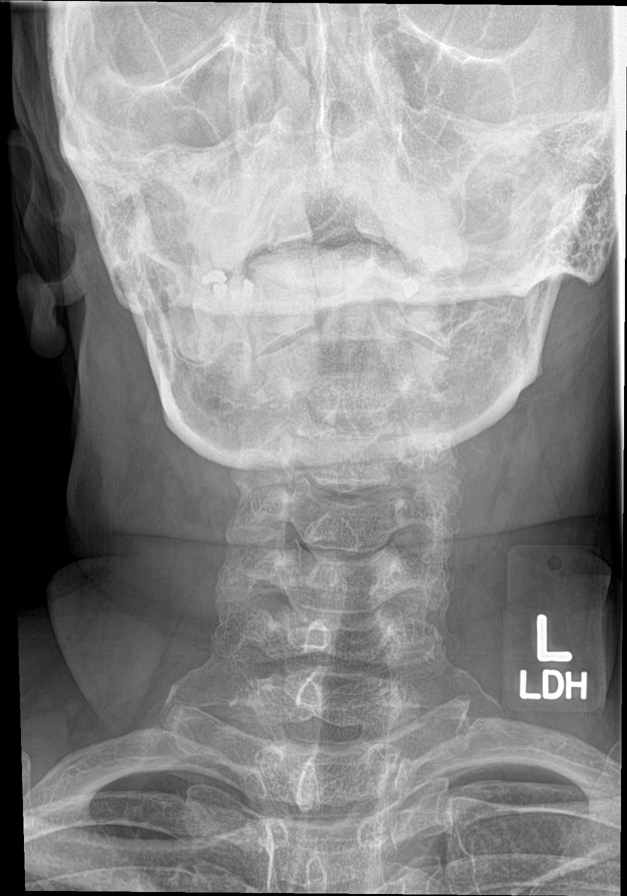

[Series 5: c-spine open mouth · 0.14mm/px · 2 of 2 slices shown]
[im 1/2]
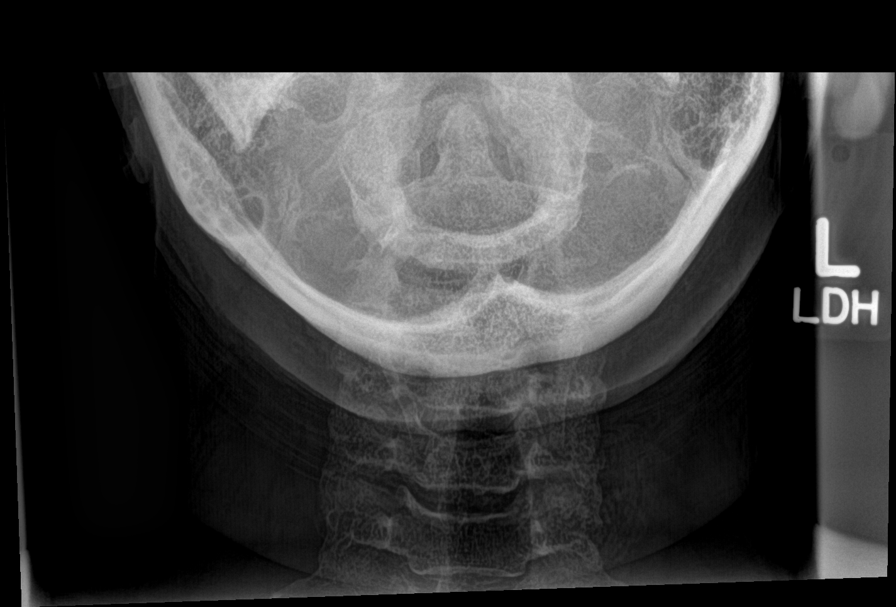
[im 2/2]
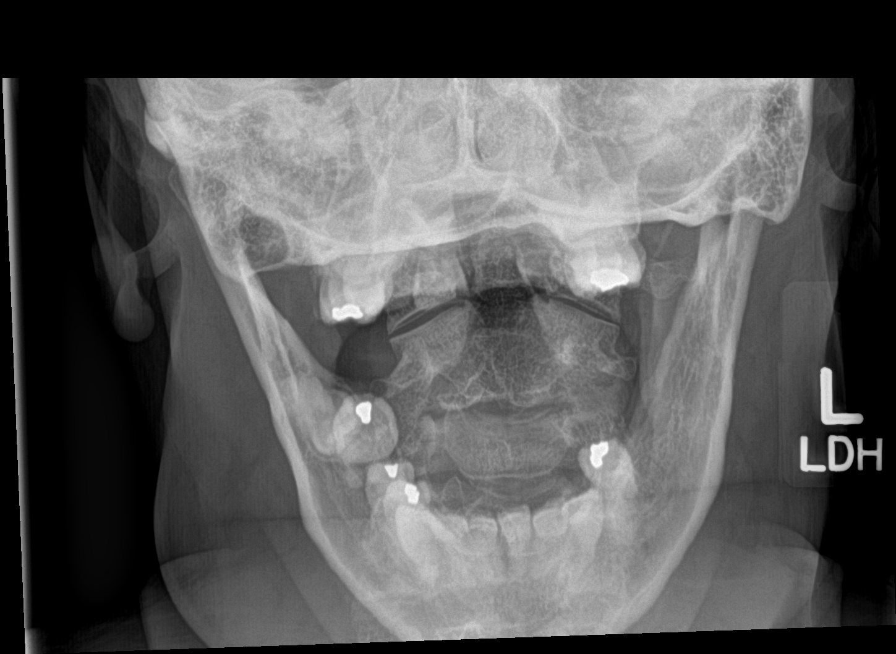

[c-spine swimmers]
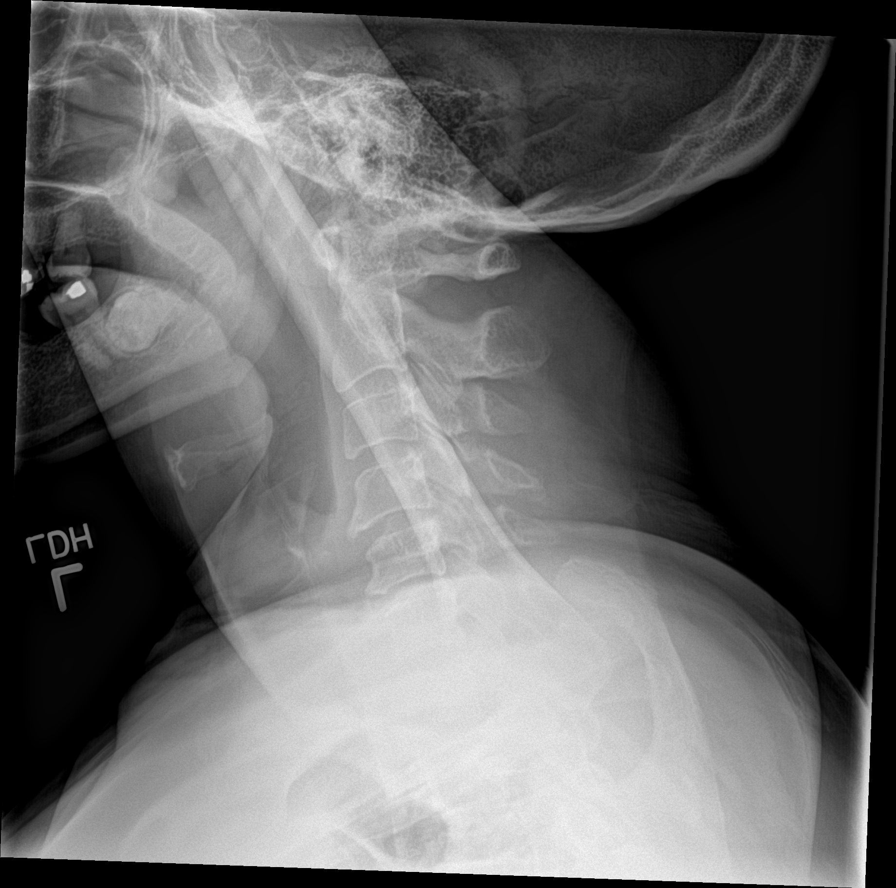

[7 of 7 positions shown; findings below may reference images not displayed]

FINDINGS: Vertebral alignment is normal. Prevertebral soft tissues are within
normal limits. Mild to moderate anterior endplate osteophytosis and
minimal disc space narrowing are present at C4-5. Mild endplate
spurring is also noted at C5-6 with annular calcification
anteriorly. Mild upper cervical facet arthrosis is noted. No
significant osseous neural foraminal narrowing is identified. No
lytic or blastic osseous lesion or acute fracture is identified.
IMPRESSION: No acute osseous abnormality identified.  Mild cervical spondylosis.

## 2017-02-08 ENCOUNTER — Emergency Department
Admission: EM | Admit: 2017-02-08 | Discharge: 2017-02-08 | Disposition: A | Discharge status: Home / Self Care | Payer: Medicaid Other | Attending: Emergency Medicine | Admitting: Emergency Medicine

## 2017-02-08 ENCOUNTER — Encounter: Payer: Self-pay | Admitting: Medical Oncology

## 2017-02-08 DIAGNOSIS — R509 Fever, unspecified: Secondary | ICD-10-CM | POA: Insufficient documentation

## 2017-02-08 DIAGNOSIS — I252 Old myocardial infarction: Secondary | ICD-10-CM | POA: Insufficient documentation

## 2017-02-08 DIAGNOSIS — R51 Headache: Secondary | ICD-10-CM | POA: Insufficient documentation

## 2017-02-08 DIAGNOSIS — R05 Cough: Secondary | ICD-10-CM | POA: Insufficient documentation

## 2017-02-08 DIAGNOSIS — R69 Illness, unspecified: Secondary | ICD-10-CM

## 2017-02-08 DIAGNOSIS — J111 Influenza due to unidentified influenza virus with other respiratory manifestations: Secondary | ICD-10-CM

## 2017-02-08 DIAGNOSIS — I10 Essential (primary) hypertension: Secondary | ICD-10-CM | POA: Insufficient documentation

## 2017-02-08 DIAGNOSIS — J45909 Unspecified asthma, uncomplicated: Secondary | ICD-10-CM | POA: Insufficient documentation

## 2017-02-08 MED ORDER — HYDROCOD POLST-CPM POLST ER 10-8 MG/5ML PO SUER: 5.0000 mL | Freq: Two times a day (BID) | ORAL | 0 refills | Status: DC

## 2017-02-08 NOTE — ED Notes (Signed)
Pt alert and oriented X4, active, cooperative, pt in NAD. RR even and unlabored, color WNL.  Pt informed to return if any life threatening symptoms occur.   

## 2017-02-08 NOTE — ED Triage Notes (Signed)
Pt reports she began Friday having flu like sx's with bad cough.

## 2017-02-08 NOTE — ED Notes (Signed)
Yellow productive cough since Friday, pain with coughing in rib area. Pt reports that her "stomach ulcers are hurting".

## 2017-02-08 NOTE — ED Provider Notes (Signed)
[facility] Emergency Department Provider Note   ____________________________________________   First MD Initiated Contact with Patient 02/08/17 1448     (approximate)  I have reviewed the triage vital signs and the nursing notes.   HISTORY  Chief Complaint Cough; Generalized Body Aches; and Fever    HPI Whitney Boyer is a 51 y.o. female patient complain of cough, nasal congestion, frontal headache, and body aches for 4 days. Patient rates the pain as a 10 over 10. Patient described a pain as "achy". Patient also states she's have a stomach pain secondary to taking ibuprofen with her history of ulcers. Patient has not taken a flu shot this season.  Past Medical History:  Diagnosis Date  . Arthritis   . Asthma   . Depression   . GERD (gastroesophageal reflux disease)   . Hypertension   . Myocardial infarction 10/2012  . Obesity     Patient Active Problem List   Diagnosis Date Noted  . BURSITIS, RIGHT HIP 06/30/2010  . CHEST WALL PAIN, ACUTE 06/30/2010  . SINUSITIS, ACUTE 05/28/2010  . FIBROIDS, UTERUS 10/27/2009  . ABNORMAL VAGINAL BLEEDING 12/10/2008  . VAGINAL DISCHARGE 10/01/2008  . DYSLIPIDEMIA 07/19/2008  . ANEMIA, IRON DEFICIENCY 06/02/2008  . ANXIETY DEPRESSION 05/10/2008  . HYPERTENSION 05/10/2008  . CHEST PAIN 05/10/2008  . DEGENERATIVE JOINT DISEASE, HIPS 11/30/2007  . DEGENERATIVE JOINT DISEASE, KNEES, BILATERAL 11/30/2007  . LATERAL EPICONDYLITIS, RIGHT 11/30/2007  . SNORING 11/30/2007  . OBESITY 09/05/2007  . GERD 09/05/2007  . PELVIC INFLAMMATORY DISEASE 09/05/2007    Past Surgical History:  Procedure Laterality Date  . FRACTURE SURGERY  2004   broken finger repair  . TUBAL LIGATION  1999    Prior to Admission medications   Medication Sig Start Date End Date Taking? Authorizing Provider  Benzocaine 10 MG LOZG Use as directed 1 lozenge (10 mg total) in the mouth or throat every 8 (eight) hours as needed. Patient  not taking: Reported on 12/14/2014 05/21/14   Leo Grosser, MD  chlorpheniramine-HYDROcodone [facility]) 10-8 MG/5ML SUER Take 5 mLs by mouth 2 (two) times daily. 02/08/17   Sable Feil, PA-C  diazepam (VALIUM) 5 MG tablet Take 1 tablet (5 mg total) by mouth 2 (two) times daily. 07/07/15   Antonietta Breach, PA-C  diclofenac sodium (VOLTAREN) 1 % GEL Apply 2 g topically 4 (four) times daily. 07/07/15   Antonietta Breach, PA-C  HYDROcodone-acetaminophen (NORCO/VICODIN) 5-325 MG per tablet Take 1-2 tablets by mouth every 6 (six) hours as needed. 07/07/15   Antonietta Breach, PA-C  ibuprofen (ADVIL,MOTRIN) 600 MG tablet Take 1 tablet (600 mg total) by mouth every 6 (six) hours as needed. Patient not taking: Reported on 07/07/2015 03/12/15   Julianne Rice, MD  methocarbamol (ROBAXIN) 500 MG tablet Take 2 tablets (1,000 mg total) by mouth every 8 (eight) hours as needed. Patient not taking: Reported on 07/07/2015 03/12/15   Julianne Rice, MD  ondansetron (ZOFRAN ODT) 4 MG disintegrating tablet Take 1 tablet (4 mg total) by mouth every 8 (eight) hours as needed for nausea. Patient not taking: Reported on 07/07/2015 03/12/15   Julianne Rice, MD    Allergies Doxycycline and Morphine and related  No family history on file.  Social History Social History  Substance Use Topics  . Smoking status: Never Smoker  . Smokeless tobacco: Never Used  . Alcohol use No    Review of Systems Constitutional: No fever/chills Eyes: No visual changes. ENT: No sore throat. Cardiovascular: Denies  chest pain. Respiratory: Denies shortness of breath. Gastrointestinal: No abdominal pain.  No nausea, no vomiting.  No diarrhea.  No constipation. Genitourinary: Negative for dysuria. Musculoskeletal: Negative for back pain. Skin: Negative for rash. Neurological: Positive for headaches, denies focal weakness or numbness.    ____________________________________________   PHYSICAL EXAM:  VITAL SIGNS: ED Triage  Vitals  Enc Vitals Group     BP 02/08/17 1427 138/82     Pulse Rate 02/08/17 1427 83     Resp 02/08/17 1427 18     Temp 02/08/17 1427 99.2 F (37.3 C)     Temp Source 02/08/17 1427 Oral     SpO2 02/08/17 1427 97 %     Weight 02/08/17 1427 260 lb (117.9 kg)     Height 02/08/17 1427 5' (1.524 m)     Head Circumference --      Peak Flow --      Pain Score 02/08/17 1428 10     Pain Loc --      Pain Edu? --      Excl. in [place]? --     Constitutional: Alert and oriented. Well appearing and in no acute distress. Eyes: Conjunctivae are normal. PERRL. EOMI. Head: Atraumatic. Nose: Edematous nasal turbinates with clear rhinorrhea Mouth/Throat: Mucous membranes are moist.  Oropharynx non-erythematous. Neck: No stridor.  No cervical spine tenderness to palpation. Hematological/Lymphatic/Immunilogical: No cervical lymphadenopathy. Cardiovascular: Normal rate, regular rhythm. Grossly normal heart sounds.  Good peripheral circulation. Respiratory: Normal respiratory effort.  No retractions. Lungs CTAB. Continue nonproductive cough Gastrointestinal: Soft and nontender. No distention. No abdominal bruits. No CVA tenderness. Musculoskeletal: No lower extremity tenderness nor edema.  No joint effusions. Neurologic:  Normal speech and language. No gross focal neurologic deficits are appreciated. No gait instability. Skin:  Skin is warm, dry and intact. No rash noted. Psychiatric: Mood and affect are normal. Speech and behavior are normal.  ____________________________________________   LABS (all labs ordered are listed, but only abnormal results are displayed)  Labs Reviewed - No data to display ____________________________________________  EKG   ____________________________________________  RADIOLOGY   ____________________________________________   PROCEDURES  Procedure(s) performed: None  Procedures  Critical Care performed:  No  ____________________________________________   INITIAL IMPRESSION / ASSESSMENT AND PLAN / ED COURSE  Pertinent labs & imaging results that were available during my care of the patient were reviewed by me and considered in my medical decision making (see chart for details).  Viral illness. Patient given discharge care instruction. Patient given a work note. Given prescription for Tussionex. Patient advised to follow-up family doctor condition persists.      ____________________________________________   FINAL CLINICAL IMPRESSION(S) / ED DIAGNOSES  Final diagnoses:  Influenza-like illness      NEW MEDICATIONS STARTED DURING THIS VISIT:  Discharge Medication List as of 02/08/2017  2:55 PM    START taking these medications   Details  chlorpheniramine-HYDROcodone (TUSSIONEX PENNKINETIC ER) 10-8 MG/5ML SUER Take 5 mLs by mouth 2 (two) times daily., Starting Tue 02/08/2017, Print         Note:  This document was prepared using Dragon voice recognition software and may include unintentional dictation errors.    Sable Feil, PA-C 02/08/17 [place], MD 02/08/17 [phone]

## 2017-04-06 ENCOUNTER — Emergency Department
Admission: EM | Admit: 2017-04-06 | Discharge: 2017-04-06 | Disposition: A | Discharge status: Home / Self Care | Payer: Medicaid Other | Attending: Emergency Medicine | Admitting: Emergency Medicine

## 2017-04-06 ENCOUNTER — Encounter: Payer: Self-pay | Admitting: *Deleted

## 2017-04-06 DIAGNOSIS — X58XXXA Exposure to other specified factors, initial encounter: Secondary | ICD-10-CM | POA: Insufficient documentation

## 2017-04-06 DIAGNOSIS — I10 Essential (primary) hypertension: Secondary | ICD-10-CM | POA: Insufficient documentation

## 2017-04-06 DIAGNOSIS — Z791 Long term (current) use of non-steroidal anti-inflammatories (NSAID): Secondary | ICD-10-CM | POA: Insufficient documentation

## 2017-04-06 DIAGNOSIS — J45909 Unspecified asthma, uncomplicated: Secondary | ICD-10-CM | POA: Insufficient documentation

## 2017-04-06 DIAGNOSIS — S46812A Strain of other muscles, fascia and tendons at shoulder and upper arm level, left arm, initial encounter: Secondary | ICD-10-CM | POA: Insufficient documentation

## 2017-04-06 DIAGNOSIS — Y929 Unspecified place or not applicable: Secondary | ICD-10-CM | POA: Insufficient documentation

## 2017-04-06 DIAGNOSIS — Y999 Unspecified external cause status: Secondary | ICD-10-CM | POA: Insufficient documentation

## 2017-04-06 DIAGNOSIS — Z79899 Other long term (current) drug therapy: Secondary | ICD-10-CM | POA: Insufficient documentation

## 2017-04-06 DIAGNOSIS — Y939 Activity, unspecified: Secondary | ICD-10-CM | POA: Insufficient documentation

## 2017-04-06 MED ORDER — CYCLOBENZAPRINE HCL 10 MG PO TABS
10.0000 mg | ORAL_TABLET | Freq: Three times a day (TID) | ORAL | 0 refills | Status: DC | PRN
Start: 2017-04-06 — End: 2017-08-09

## 2017-04-06 NOTE — ED Provider Notes (Signed)
[facility] Emergency Department Provider Note ____________________________________________  Time seen: Approximately 4:46 PM  I have reviewed the triage vital signs and the nursing notes.   HISTORY  Chief Complaint Arm Pain    HPI Whitney Boyer is a 51 y.o. female who presents to the emergency department for evaluation of left upper left arm pain. No relief with tramadol. Pain worse with movement. Symptoms started 3 days ago. No injury. She has a history of gout and OA.  Past Medical History:  Diagnosis Date  . Arthritis   . Asthma   . Depression   . GERD (gastroesophageal reflux disease)   . Hypertension   . Myocardial infarction ([place]) 10/2012  . Obesity     Patient Active Problem List   Diagnosis Date Noted  . BURSITIS, RIGHT HIP 06/30/2010  . CHEST WALL PAIN, ACUTE 06/30/2010  . SINUSITIS, ACUTE 05/28/2010  . FIBROIDS, UTERUS 10/27/2009  . ABNORMAL VAGINAL BLEEDING 12/10/2008  . VAGINAL DISCHARGE 10/01/2008  . DYSLIPIDEMIA 07/19/2008  . ANEMIA, IRON DEFICIENCY 06/02/2008  . ANXIETY DEPRESSION 05/10/2008  . HYPERTENSION 05/10/2008  . CHEST PAIN 05/10/2008  . DEGENERATIVE JOINT DISEASE, HIPS 11/30/2007  . DEGENERATIVE JOINT DISEASE, KNEES, BILATERAL 11/30/2007  . LATERAL EPICONDYLITIS, RIGHT 11/30/2007  . SNORING 11/30/2007  . OBESITY 09/05/2007  . GERD 09/05/2007  . PELVIC INFLAMMATORY DISEASE 09/05/2007    Past Surgical History:  Procedure Laterality Date  . FRACTURE SURGERY  2004   broken finger repair  . TUBAL LIGATION  1999    Prior to Admission medications   Medication Sig Start Date End Date Taking? Authorizing Provider  Benzocaine 10 MG LOZG Use as directed 1 lozenge (10 mg total) in the mouth or throat every 8 (eight) hours as needed. Patient not taking: Reported on 12/14/2014 05/21/14   Leo Grosser, MD  chlorpheniramine-HYDROcodone [facility] ER) 10-8 MG/5ML SUER Take 5 mLs by mouth 2 (two) times  daily. 02/08/17   Sable Feil, PA-C  cyclobenzaprine (FLEXERIL) 10 MG tablet Take 1 tablet (10 mg total) by mouth 3 (three) times daily as needed for muscle spasms. 04/06/17   Victorino Dike, FNP  diazepam (VALIUM) 5 MG tablet Take 1 tablet (5 mg total) by mouth 2 (two) times daily. 07/07/15   Antonietta Breach, PA-C  diclofenac sodium (VOLTAREN) 1 % GEL Apply 2 g topically 4 (four) times daily. 07/07/15   Antonietta Breach, PA-C  HYDROcodone-acetaminophen (NORCO/VICODIN) 5-325 MG per tablet Take 1-2 tablets by mouth every 6 (six) hours as needed. 07/07/15   Antonietta Breach, PA-C  ibuprofen (ADVIL,MOTRIN) 600 MG tablet Take 1 tablet (600 mg total) by mouth every 6 (six) hours as needed. Patient not taking: Reported on 07/07/2015 03/12/15   Julianne Rice, MD  methocarbamol (ROBAXIN) 500 MG tablet Take 2 tablets (1,000 mg total) by mouth every 8 (eight) hours as needed. Patient not taking: Reported on 07/07/2015 03/12/15   Julianne Rice, MD  ondansetron (ZOFRAN ODT) 4 MG disintegrating tablet Take 1 tablet (4 mg total) by mouth every 8 (eight) hours as needed for nausea. Patient not taking: Reported on 07/07/2015 03/12/15   Julianne Rice, MD    Allergies Doxycycline and Morphine and related  History reviewed. No pertinent family history.  Social History Social History  Substance Use Topics  . Smoking status: Never Smoker  . Smokeless tobacco: Never Used  . Alcohol use No    Review of Systems Constitutional: No recent illness. Cardiovascular: Denies chest pain or palpitations. Respiratory: Denies shortness of breath.  Musculoskeletal: Pain in right upper arm. Skin: Negative for rash, wound, lesion. Neurological: Negative for focal weakness or numbness.  ____________________________________________   PHYSICAL EXAM:  VITAL SIGNS: ED Triage Vitals  Enc Vitals Group     BP 04/06/17 1630 (!) 149/72     Pulse Rate 04/06/17 1630 70     Resp 04/06/17 1630 18     Temp 04/06/17 1630 97.9 F (36.6 C)      Temp Source 04/06/17 1630 Oral     SpO2 04/06/17 1630 98 %     Weight 04/06/17 1628 250 lb (113.4 kg)     Height 04/06/17 1628 5' (1.524 m)     Head Circumference --      Peak Flow --      Pain Score 04/06/17 1628 10     Pain Loc --      Pain Edu? --      Excl. in [place]? --     Constitutional: Alert and oriented. Well appearing and in no acute distress. Eyes: Conjunctivae are normal. EOMI. Head: Atraumatic. Neck: No stridor.  Respiratory: Normal respiratory effort.   Musculoskeletal: Right anterior deltoid tenderness to palpation and with  Neurologic:  Normal speech and language. No gross focal neurologic deficits are appreciated. Speech is normal. No gait instability. Skin:  Skin is warm, dry and intact. Atraumatic. Psychiatric: Mood and affect are normal. Speech and behavior are normal.  ____________________________________________   LABS (all labs ordered are listed, but only abnormal results are displayed)  Labs Reviewed - No data to display ____________________________________________  RADIOLOGY  Not indicated. ____________________________________________   PROCEDURES  Procedure(s) performed: None  ____________________________________________   INITIAL IMPRESSION / ASSESSMENT AND PLAN / ED COURSE  51 year old female presenting to the emergency department for evaluation of left upper arm pain. Location of pain and symptoms are consistent with deltoid muscle strain. She will be treated with Flexeril and advised to follow-up with her primary care provider at [facility] for symptoms that are not improving over the week. She was instructed to return to the emergency department for symptoms that change or worsen if she is unable to schedule an appointment.  Pertinent labs & imaging results that were available during my care of the patient were reviewed by me and considered in my medical decision making (see chart for  details).  _________________________________________   FINAL CLINICAL IMPRESSION(S) / ED DIAGNOSES  Final diagnoses:  Strain of deltoid muscle, left, initial encounter    Discharge Medication List as of 04/06/2017  5:16 PM    START taking these medications   Details  cyclobenzaprine (FLEXERIL) 10 MG tablet Take 1 tablet (10 mg total) by mouth 3 (three) times daily as needed for muscle spasms., Starting Wed 04/06/2017, Print        If controlled substance prescribed during this visit, 12 month history viewed on the [place] prior to issuing an initial prescription for Schedule II or III opiod.    Victorino Dike, FNP 04/06/17 2124    Nance Pear, MD 04/06/17 2220

## 2017-04-06 NOTE — ED Notes (Signed)
Pt presents with left upper arm pain x 3 days. She denies injury or trauma; states it hurts worse with movement, especially if she moves it toward the back. Forearm doesn't hurt with movement if shoulder is still. Pt states her fingers have been tingling since yesterday. Pt alert & oriented with NAD noted.

## 2017-04-06 NOTE — ED Triage Notes (Signed)
States 3 days ago she began to have upper left arm pain, states no relief with tramadol but states the pain worsens with movement, states she can not push her arm back without the pain increasing, pt guarding left arm

## 2017-04-06 NOTE — Discharge Instructions (Signed)
Follow up with your PCP for symptoms that are not improving over the week. Return to the ER for symptoms that change or worsen if unable to schedule an appointment.

## 2017-08-09 ENCOUNTER — Emergency Department: Payer: No Typology Code available for payment source

## 2017-08-09 ENCOUNTER — Emergency Department
Admission: EM | Admit: 2017-08-09 | Discharge: 2017-08-09 | Disposition: A | Discharge status: Home / Self Care | Payer: No Typology Code available for payment source | Attending: Emergency Medicine | Admitting: Emergency Medicine

## 2017-08-09 DIAGNOSIS — R0789 Other chest pain: Secondary | ICD-10-CM | POA: Insufficient documentation

## 2017-08-09 DIAGNOSIS — Y929 Unspecified place or not applicable: Secondary | ICD-10-CM | POA: Insufficient documentation

## 2017-08-09 DIAGNOSIS — M7918 Myalgia, other site: Secondary | ICD-10-CM

## 2017-08-09 DIAGNOSIS — Y999 Unspecified external cause status: Secondary | ICD-10-CM | POA: Insufficient documentation

## 2017-08-09 DIAGNOSIS — S39012A Strain of muscle, fascia and tendon of lower back, initial encounter: Secondary | ICD-10-CM | POA: Diagnosis not present

## 2017-08-09 DIAGNOSIS — G44319 Acute post-traumatic headache, not intractable: Secondary | ICD-10-CM | POA: Diagnosis not present

## 2017-08-09 DIAGNOSIS — Y939 Activity, unspecified: Secondary | ICD-10-CM | POA: Diagnosis not present

## 2017-08-09 DIAGNOSIS — R519 Headache, unspecified: Secondary | ICD-10-CM

## 2017-08-09 DIAGNOSIS — I259 Chronic ischemic heart disease, unspecified: Secondary | ICD-10-CM | POA: Insufficient documentation

## 2017-08-09 DIAGNOSIS — J45909 Unspecified asthma, uncomplicated: Secondary | ICD-10-CM | POA: Insufficient documentation

## 2017-08-09 DIAGNOSIS — M791 Myalgia: Secondary | ICD-10-CM | POA: Insufficient documentation

## 2017-08-09 DIAGNOSIS — Z79899 Other long term (current) drug therapy: Secondary | ICD-10-CM | POA: Insufficient documentation

## 2017-08-09 DIAGNOSIS — S0990XA Unspecified injury of head, initial encounter: Secondary | ICD-10-CM | POA: Diagnosis present

## 2017-08-09 DIAGNOSIS — I10 Essential (primary) hypertension: Secondary | ICD-10-CM | POA: Insufficient documentation

## 2017-08-09 DIAGNOSIS — R51 Headache: Secondary | ICD-10-CM

## 2017-08-09 MED ORDER — ACETAMINOPHEN 500 MG PO TABS
1000.0000 mg | ORAL_TABLET | Freq: Once | ORAL | Status: AC
  Administered 2017-08-09: 1000 mg via ORAL
  Filled 2017-08-09: qty 2

## 2017-08-09 MED ORDER — CYCLOBENZAPRINE HCL 10 MG PO TABS: 10.0000 mg | ORAL_TABLET | Freq: Three times a day (TID) | ORAL | 0 refills | Status: DC | PRN

## 2017-08-09 MED ORDER — CYCLOBENZAPRINE HCL 10 MG PO TABS
10.0000 mg | ORAL_TABLET | Freq: Once | ORAL | Status: AC
  Administered 2017-08-09: 10 mg via ORAL
  Filled 2017-08-09: qty 1

## 2017-08-09 NOTE — ED Notes (Signed)
No noticeable abrasions or scrapes noted to pt's body. Pt states soreness over right eye.

## 2017-08-09 NOTE — ED Notes (Signed)
Esign wouldn't work. Pt verbalized discharge instructions and has no questions at this time.
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
## 2017-08-19 ENCOUNTER — Emergency Department (HOSPITAL_COMMUNITY): Payer: No Typology Code available for payment source

## 2017-08-19 ENCOUNTER — Encounter (HOSPITAL_COMMUNITY): Payer: Self-pay

## 2017-08-19 ENCOUNTER — Emergency Department (HOSPITAL_COMMUNITY)
Admission: EM | Admit: 2017-08-19 | Discharge: 2017-08-19 | Disposition: A | Discharge status: Home / Self Care | Payer: No Typology Code available for payment source | Attending: Emergency Medicine | Admitting: Emergency Medicine

## 2017-08-19 DIAGNOSIS — J45909 Unspecified asthma, uncomplicated: Secondary | ICD-10-CM | POA: Diagnosis not present

## 2017-08-19 DIAGNOSIS — Y939 Activity, unspecified: Secondary | ICD-10-CM | POA: Insufficient documentation

## 2017-08-19 DIAGNOSIS — Z885 Allergy status to narcotic agent status: Secondary | ICD-10-CM | POA: Diagnosis not present

## 2017-08-19 DIAGNOSIS — I10 Essential (primary) hypertension: Secondary | ICD-10-CM | POA: Insufficient documentation

## 2017-08-19 DIAGNOSIS — Y929 Unspecified place or not applicable: Secondary | ICD-10-CM | POA: Diagnosis not present

## 2017-08-19 DIAGNOSIS — M542 Cervicalgia: Secondary | ICD-10-CM | POA: Insufficient documentation

## 2017-08-19 DIAGNOSIS — M545 Low back pain, unspecified: Secondary | ICD-10-CM

## 2017-08-19 DIAGNOSIS — I252 Old myocardial infarction: Secondary | ICD-10-CM | POA: Insufficient documentation

## 2017-08-19 DIAGNOSIS — Y999 Unspecified external cause status: Secondary | ICD-10-CM | POA: Insufficient documentation

## 2017-08-19 MED ORDER — METHOCARBAMOL 500 MG PO TABS: 500.0000 mg | ORAL_TABLET | Freq: Two times a day (BID) | ORAL | 0 refills | Status: AC | PRN

## 2017-08-19 MED ORDER — NAPROXEN 250 MG PO TABS: 500.0000 mg | ORAL_TABLET | Freq: Once | ORAL | Status: DC

## 2017-08-19 MED ORDER — HYDROCODONE-ACETAMINOPHEN 5-325 MG PO TABS
1.0000 | ORAL_TABLET | Freq: Once | ORAL | Status: AC
  Administered 2017-08-19: 1 via ORAL
  Filled 2017-08-19: qty 1

## 2017-08-19 MED ORDER — ACETAMINOPHEN 325 MG PO TABS: 650.0000 mg | ORAL_TABLET | Freq: Four times a day (QID) | ORAL | 0 refills | Status: AC | PRN
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
## 2017-08-19 NOTE — ED Notes (Signed)
Returned from xray

## 2017-08-19 NOTE — ED Notes (Signed)
Patient transported to X-ray 
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
## 2017-09-22 ENCOUNTER — Emergency Department (HOSPITAL_COMMUNITY): Payer: Self-pay

## 2017-09-22 ENCOUNTER — Emergency Department (HOSPITAL_COMMUNITY)
Admission: EM | Admit: 2017-09-22 | Discharge: 2017-09-22 | Disposition: A | Discharge status: Home / Self Care | Payer: Self-pay | Attending: Emergency Medicine | Admitting: Emergency Medicine

## 2017-09-22 DIAGNOSIS — I252 Old myocardial infarction: Secondary | ICD-10-CM | POA: Insufficient documentation

## 2017-09-22 DIAGNOSIS — Z79899 Other long term (current) drug therapy: Secondary | ICD-10-CM | POA: Insufficient documentation

## 2017-09-22 DIAGNOSIS — M79671 Pain in right foot: Secondary | ICD-10-CM | POA: Insufficient documentation

## 2017-09-22 DIAGNOSIS — I10 Essential (primary) hypertension: Secondary | ICD-10-CM | POA: Insufficient documentation

## 2017-09-22 DIAGNOSIS — J45909 Unspecified asthma, uncomplicated: Secondary | ICD-10-CM | POA: Insufficient documentation

## 2017-09-22 MED ORDER — DICLOFENAC SODIUM 1 % TD GEL
2.0000 g | Freq: Once | TRANSDERMAL | Status: AC
  Administered 2017-09-22: 2 g via TOPICAL
  Filled 2017-09-22: qty 100
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
## 2017-09-22 NOTE — Discharge Instructions (Signed)
You can use Voltaren gel and Tylenol for pain, continue to elevate foot, use ice and heat and Ace wrap for comfort. Follow-up with podiatry as we discussed. Return to the emergency department if you develop worsening pain, or no longer able to walk on the foot or other new or concerning symptoms develop.

## 2017-09-22 NOTE — ED Notes (Signed)
Patient able to ambulate independently  
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
## 2018-03-15 ENCOUNTER — Other Ambulatory Visit: Payer: Self-pay

## 2018-03-15 ENCOUNTER — Emergency Department: Payer: Self-pay

## 2018-03-15 ENCOUNTER — Emergency Department
Admission: EM | Admit: 2018-03-15 | Discharge: 2018-03-15 | Disposition: A | Discharge status: Home / Self Care | Payer: Self-pay | Attending: Emergency Medicine | Admitting: Emergency Medicine

## 2018-03-15 DIAGNOSIS — R002 Palpitations: Secondary | ICD-10-CM | POA: Insufficient documentation

## 2018-03-15 DIAGNOSIS — Z79899 Other long term (current) drug therapy: Secondary | ICD-10-CM | POA: Insufficient documentation

## 2018-03-15 DIAGNOSIS — I10 Essential (primary) hypertension: Secondary | ICD-10-CM | POA: Insufficient documentation

## 2018-03-15 DIAGNOSIS — E876 Hypokalemia: Secondary | ICD-10-CM | POA: Insufficient documentation

## 2018-03-15 DIAGNOSIS — M25551 Pain in right hip: Secondary | ICD-10-CM | POA: Insufficient documentation

## 2018-03-15 DIAGNOSIS — J45909 Unspecified asthma, uncomplicated: Secondary | ICD-10-CM | POA: Insufficient documentation

## 2018-03-15 DIAGNOSIS — G8929 Other chronic pain: Secondary | ICD-10-CM | POA: Insufficient documentation

## 2018-03-15 LAB — BASIC METABOLIC PANEL
ANION GAP: 11 (ref 5–15)
BUN: 13 mg/dL (ref 6–20)
CO2: 29 mmol/L (ref 22–32)
Calcium: 9.1 mg/dL (ref 8.9–10.3)
Chloride: 98 mmol/L — ABNORMAL LOW (ref 101–111)
Creatinine, Ser: 0.64 mg/dL (ref 0.44–1.00)
Glucose, Bld: 93 mg/dL (ref 65–99)
POTASSIUM: 3.2 mmol/L — AB (ref 3.5–5.1)
SODIUM: 138 mmol/L (ref 135–145)

## 2018-03-15 LAB — CBC
HEMATOCRIT: 40.8 % (ref 35.0–47.0)
Hemoglobin: 13.6 g/dL (ref 12.0–16.0)
MCH: 27.7 pg (ref 26.0–34.0)
MCHC: 33.3 g/dL (ref 32.0–36.0)
MCV: 82.9 fL (ref 80.0–100.0)
Platelets: 393 10*3/uL (ref 150–440)
RBC: 4.92 MIL/uL (ref 3.80–5.20)
RDW: 14.7 % — ABNORMAL HIGH (ref 11.5–14.5)
WBC: 8.1 10*3/uL (ref 3.6–11.0)

## 2018-03-15 LAB — MAGNESIUM: MAGNESIUM: 2 mg/dL (ref 1.7–2.4)

## 2018-03-15 LAB — TROPONIN I: Troponin I: <0.03 ng/mL (ref <0.03)

## 2018-03-15 MED ORDER — POTASSIUM CHLORIDE CRYS ER 20 MEQ PO TBCR
40.0000 meq | EXTENDED_RELEASE_TABLET | Freq: Once | ORAL | Status: AC
  Administered 2018-03-15: 40 meq via ORAL
  Filled 2018-03-15: qty 2

## 2018-03-15 MED ORDER — POTASSIUM CHLORIDE CRYS ER 20 MEQ PO TBCR: 20.0000 meq | EXTENDED_RELEASE_TABLET | Freq: Every day | ORAL | 0 refills | Status: AC

## 2018-03-15 NOTE — Discharge Instructions (Addendum)
Your workup in the Emergency Department today was reassuring.  We did not find any specific abnormalities.  We recommend you drink plenty of fluids, take your regular medications and/or any new ones prescribed today, and follow up with the doctor(s) listed in these documents as recommended.  Return to the Emergency Department if you develop new or worsening symptoms that concern you.  

## 2018-03-15 NOTE — ED Provider Notes (Signed)
[facility] Emergency Department Provider Note  ____________________________________________   First MD Initiated Contact with Patient 03/15/18 1629     (approximate)  I have reviewed the triage vital signs and the nursing notes.   HISTORY  Chief Complaint Chest Pain and Hip Pain    HPI Whitney Boyer is a 52 y.o. female with medical issues as listed below who presents for evaluation of several weeks of a fluttering sensation in her chest.  She is not sure whether to call palpitations, but sometimes she feels a fluttering on the right side of her chest and sometimes on the left.  It is not accompanied with any chest pain or pressure.  She denies shortness of breath, cough, fever/chills, nausea, vomiting, and abdominal pain.  She has some members of her family that have had heart attacks so she was worried that might be the case with her, but she has no personal history of heart disease that she reports (of note, her past medical history lists myocardial infarction).  She reports that she has hypertension but denies diabetes, hyperlipidemia, and tobacco use.  Nothing in particular makes her symptoms better or worse but she describes them as mild and intermittent and persistent for weeks.  She is also reporting pain in her right hip but states that this is been going on for months or years and she knows it is due to her arthritis and she is going to talk about it with her primary care doctor when she has an appointment next week (already scheduled).  Past Medical History:  Diagnosis Date  . Arthritis   . Asthma   . Depression   . GERD (gastroesophageal reflux disease)   . Hypertension   . Myocardial infarction ([place]) 10/2012  . Obesity     Patient Active Problem List   Diagnosis Date Noted  . BURSITIS, RIGHT HIP 06/30/2010  . CHEST WALL PAIN, ACUTE 06/30/2010  . SINUSITIS, ACUTE 05/28/2010  . FIBROIDS, UTERUS 10/27/2009  . ABNORMAL VAGINAL  BLEEDING 12/10/2008  . VAGINAL DISCHARGE 10/01/2008  . DYSLIPIDEMIA 07/19/2008  . ANEMIA, IRON DEFICIENCY 06/02/2008  . ANXIETY DEPRESSION 05/10/2008  . HYPERTENSION 05/10/2008  . CHEST PAIN 05/10/2008  . DEGENERATIVE JOINT DISEASE, HIPS 11/30/2007  . DEGENERATIVE JOINT DISEASE, KNEES, BILATERAL 11/30/2007  . LATERAL EPICONDYLITIS, RIGHT 11/30/2007  . SNORING 11/30/2007  . OBESITY 09/05/2007  . GERD 09/05/2007  . PELVIC INFLAMMATORY DISEASE 09/05/2007    Past Surgical History:  Procedure Laterality Date  . FRACTURE SURGERY  2004   broken finger repair  . TUBAL LIGATION  1999    Prior to Admission medications   Medication Sig Start Date End Date Taking? Authorizing Provider  acetaminophen (TYLENOL) 325 MG tablet Take 2 tablets (650 mg total) by mouth every 6 (six) hours as needed for mild pain or moderate pain. 08/19/17   Waynetta Pean, PA-C  diclofenac sodium (VOLTAREN) 1 % GEL Apply 2 g topically 4 (four) times daily. 07/07/15   Antonietta Breach, PA-C  methocarbamol (ROBAXIN) 500 MG tablet Take 1 tablet (500 mg total) by mouth 2 (two) times daily as needed for muscle spasms. 08/19/17   Waynetta Pean, PA-C  ondansetron (ZOFRAN ODT) 4 MG disintegrating tablet Take 1 tablet (4 mg total) by mouth every 8 (eight) hours as needed for nausea. Patient not taking: Reported on 07/07/2015 03/12/15   Julianne Rice, MD  potassium chloride SA (KLOR-CON M20) 20 MEQ tablet Take 1 tablet (20 mEq total) by mouth daily. 03/15/18   Karma Greaser,  Tommi Rumps, MD    Allergies Doxycycline and Morphine and related  No family history on file.  Social History Social History   Tobacco Use  . Smoking status: Never Smoker  . Smokeless tobacco: Never Used  Substance Use Topics  . Alcohol use: No  . Drug use: No    Review of Systems Constitutional: No fever/chills Eyes: No visual changes. ENT: No sore throat. Cardiovascular: "Fluttering" in her chest, no pain or pressure, as described above in  HPI Respiratory: Denies shortness of breath. Gastrointestinal: No abdominal pain.  No nausea, no vomiting.  No diarrhea.  No constipation. Genitourinary: Negative for dysuria. Musculoskeletal: Negative for neck pain.  Negative for back pain.  Chronic right hip pain. Integumentary: Negative for rash. Neurological: Negative for headaches, focal weakness or numbness.   ____________________________________________   PHYSICAL EXAM:  VITAL SIGNS: ED Triage Vitals  Enc Vitals Group     BP 03/15/18 1510 (!) 143/73     Pulse Rate 03/15/18 1510 77     Resp --      Temp 03/15/18 1510 98.3 F (36.8 C)     Temp Source 03/15/18 1510 Oral     SpO2 03/15/18 1510 98 %     Weight 03/15/18 1510 117.9 kg (260 lb)     Height 03/15/18 1510 1.524 m (5')     Head Circumference --      Peak Flow --      Pain Score 03/15/18 1539 7     Pain Loc --      Pain Edu? --      Excl. in [place]? --     Constitutional: Alert and oriented. Well appearing and in no acute distress. Eyes: Conjunctivae are normal.  Head: Atraumatic. Nose: No congestion/rhinnorhea. Mouth/Throat: Mucous membranes are moist. Neck: No stridor.  No meningeal signs.   Cardiovascular: Normal rate, regular rhythm. Good peripheral circulation. Grossly normal heart sounds. Respiratory: Normal respiratory effort.  No retractions. Lungs CTAB. Gastrointestinal: Soft and nontender. No distention.  Musculoskeletal: No lower extremity tenderness nor edema. No gross deformities of extremities.  Ambulatory without difficulty Neurologic:  Normal speech and language. No gross focal neurologic deficits are appreciated.  Skin:  Skin is warm, dry and intact. No rash noted. Psychiatric: Mood and affect are normal. Speech and behavior are normal.  ____________________________________________   LABS (all labs ordered are listed, but only abnormal results are displayed)  Labs Reviewed  BASIC METABOLIC PANEL - Abnormal; Notable for the following  components:      Result Value   Potassium 3.2 (*)    Chloride 98 (*)    All other components within normal limits  CBC - Abnormal; Notable for the following components:   RDW 14.7 (*)    All other components within normal limits  TROPONIN I  MAGNESIUM   ____________________________________________  EKG  ED ECG REPORT I, Hinda Kehr, the attending physician, personally viewed and interpreted this ECG.  Date: 03/15/2018 EKG Time: 15: 17 Rate: 70 Rhythm: normal sinus rhythm QRS Axis: normal Intervals: normal ST/T Wave abnormalities: normal Narrative Interpretation: no evidence of acute ischemia  ____________________________________________  RADIOLOGY I, Hinda Kehr, personally viewed and evaluated these images (plain radiographs) as part of my medical decision making, as well as reviewing the written report by the radiologist.  ED MD interpretation: No acute infiltrates or obvious abnormalities  Official radiology report(s): Dg Chest 2 View  Result Date: 03/15/2018 CLINICAL DATA:  Chest pain. EXAM: CHEST - 2 VIEW COMPARISON:  Radiographs of August 09, 2017. FINDINGS: The heart size and mediastinal contours are within normal limits. Both lungs are clear. No pneumothorax or pleural effusion is noted. The visualized skeletal structures are unremarkable. IMPRESSION: No active cardiopulmonary disease. Electronically Signed   By: Marijo Conception, M.D.   On: 03/15/2018 16:35    ____________________________________________   PROCEDURES  Critical Care performed: No   Procedure(s) performed:   Procedures   ____________________________________________   INITIAL IMPRESSION / ASSESSMENT AND PLAN / ED COURSE  As part of my medical decision making, I reviewed the following data within the [facility] notes reviewed and incorporated, Labs reviewed , EKG interpreted  and Radiograph reviewed     Differential diagnosis includes, but is not limited to,  SVT/AVNRT, gastrointestinal causes such as reflux, ACS, PE.  The patient is very well-appearing and is been having symptoms for weeks.  Her lab work is all reassuring except for a low potassium which theoretically could lead to some cardiac arrhythmia.  I will check her magnesium to see if she needs a magnesium supplement as well, but her workup is very reassuring with low risk HEART score, zero on Wells Score for PE, long-term symptoms, and at a follow-up appointment with her primary care provider next week.  I told her they may consider discussing a Holter monitor but at this point there is no evidence of any acute or emergent medical condition.  I gave her a potassium supplement in the emergency department and a prescription for additional potassium supplement that she can take until she follows up in [facility].  She understands and agrees with the plan.  I gave my usual and customary return precautions.   Clinical Course as of Mar 15 1728  Wed Mar 15, 2018  1720 Magnesium: 2.0 [CF]    Clinical Course User Index [CF] Hinda Kehr, MD    ____________________________________________  FINAL CLINICAL IMPRESSION(S) / ED DIAGNOSES  Final diagnoses:  Palpitations  Chronic right hip pain  Hypokalemia     MEDICATIONS GIVEN DURING THIS VISIT:  Medications  potassium chloride SA (K-DUR,KLOR-CON) CR tablet 40 mEq (40 mEq Oral Given 03/15/18 1717)     ED Discharge Orders        Ordered    potassium chloride SA (KLOR-CON M20) 20 MEQ tablet  Daily     03/15/18 1724       Note:  This document was prepared using Dragon voice recognition software and may include unintentional dictation errors.    Hinda Kehr, MD 03/15/18 1729

## 2018-03-15 NOTE — ED Triage Notes (Signed)
Pt c/o sharp central chest pains with "feeling like it is fluttering" for over a week. Denies SOB .Marland Kitchen Pt also c/o right hip pain that radiates into the groin, states is has made it difficult to walk.

## 2018-04-01 ENCOUNTER — Emergency Department (HOSPITAL_COMMUNITY)
Admission: EM | Admit: 2018-04-01 | Discharge: 2018-04-01 | Disposition: A | Discharge status: Home / Self Care | Payer: No Typology Code available for payment source | Attending: Emergency Medicine | Admitting: Emergency Medicine

## 2018-04-01 ENCOUNTER — Emergency Department (HOSPITAL_COMMUNITY): Payer: No Typology Code available for payment source

## 2018-04-01 ENCOUNTER — Other Ambulatory Visit: Payer: Self-pay

## 2018-04-01 ENCOUNTER — Encounter (HOSPITAL_COMMUNITY): Payer: Self-pay | Admitting: Emergency Medicine

## 2018-04-01 DIAGNOSIS — Z79899 Other long term (current) drug therapy: Secondary | ICD-10-CM | POA: Insufficient documentation

## 2018-04-01 DIAGNOSIS — M25551 Pain in right hip: Secondary | ICD-10-CM | POA: Insufficient documentation

## 2018-04-01 DIAGNOSIS — M25559 Pain in unspecified hip: Secondary | ICD-10-CM

## 2018-04-01 DIAGNOSIS — I10 Essential (primary) hypertension: Secondary | ICD-10-CM | POA: Insufficient documentation

## 2018-04-01 DIAGNOSIS — J45909 Unspecified asthma, uncomplicated: Secondary | ICD-10-CM | POA: Diagnosis not present

## 2018-04-01 MED ORDER — CYCLOBENZAPRINE HCL 10 MG PO TABS
10.0000 mg | ORAL_TABLET | Freq: Once | ORAL | Status: AC
  Administered 2018-04-01: 10 mg via ORAL
  Filled 2018-04-01: qty 1

## 2018-04-01 MED ORDER — CYCLOBENZAPRINE HCL 10 MG PO TABS: 10.0000 mg | ORAL_TABLET | Freq: Two times a day (BID) | ORAL | 0 refills | Status: AC | PRN

## 2018-04-01 MED ORDER — ACETAMINOPHEN 325 MG PO TABS
650.0000 mg | ORAL_TABLET | Freq: Once | ORAL | Status: AC
  Administered 2018-04-01: 650 mg via ORAL
  Filled 2018-04-01: qty 2

## 2018-04-01 MED ORDER — LIDOCAINE 5 % EX PTCH
1.0000 | MEDICATED_PATCH | CUTANEOUS | Status: DC
  Administered 2018-04-01: 1 via TRANSDERMAL
  Filled 2018-04-01: qty 1

## 2018-04-01 MED ORDER — LIDOCAINE 5 % EX PTCH: 1.0000 | MEDICATED_PATCH | CUTANEOUS | Status: DC

## 2018-04-01 NOTE — ED Notes (Signed)
Patient verbalizes understanding of discharge instructions. Opportunity for questioning and answers were provided. Armband removed by staff, pt discharged from ED.  
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
## 2018-04-01 NOTE — ED Notes (Signed)
EDP at bedside  

## 2018-11-23 ENCOUNTER — Emergency Department (HOSPITAL_COMMUNITY): Payer: Medicaid Other

## 2018-11-23 ENCOUNTER — Other Ambulatory Visit: Payer: Self-pay

## 2018-11-23 ENCOUNTER — Emergency Department (HOSPITAL_COMMUNITY)
Admission: EM | Admit: 2018-11-23 | Discharge: 2018-11-23 | Disposition: A | Discharge status: Home / Self Care | Payer: Medicaid Other | Attending: Emergency Medicine | Admitting: Emergency Medicine

## 2018-11-23 ENCOUNTER — Encounter (HOSPITAL_COMMUNITY): Payer: Self-pay | Admitting: Emergency Medicine

## 2018-11-23 DIAGNOSIS — Z79899 Other long term (current) drug therapy: Secondary | ICD-10-CM | POA: Insufficient documentation

## 2018-11-23 DIAGNOSIS — R109 Unspecified abdominal pain: Secondary | ICD-10-CM

## 2018-11-23 DIAGNOSIS — J45909 Unspecified asthma, uncomplicated: Secondary | ICD-10-CM | POA: Insufficient documentation

## 2018-11-23 DIAGNOSIS — M549 Dorsalgia, unspecified: Secondary | ICD-10-CM | POA: Insufficient documentation

## 2018-11-23 DIAGNOSIS — R1032 Left lower quadrant pain: Secondary | ICD-10-CM | POA: Insufficient documentation

## 2018-11-23 DIAGNOSIS — I10 Essential (primary) hypertension: Secondary | ICD-10-CM | POA: Insufficient documentation

## 2018-11-23 LAB — CBC
HCT: 42.6 % (ref 36.0–46.0)
Hemoglobin: 13.9 g/dL (ref 12.0–15.0)
MCH: 27.6 pg (ref 26.0–34.0)
MCHC: 32.6 g/dL (ref 30.0–36.0)
MCV: 84.5 fL (ref 80.0–100.0)
Platelets: 427 K/uL — ABNORMAL HIGH (ref 150–400)
RBC: 5.04 MIL/uL (ref 3.87–5.11)
RDW: 14.8 % (ref 11.5–15.5)
WBC: 12.4 K/uL — ABNORMAL HIGH (ref 4.0–10.5)
nRBC: 0 % (ref 0.0–0.2)

## 2018-11-23 LAB — URINALYSIS, ROUTINE W REFLEX MICROSCOPIC
Bilirubin Urine: NEGATIVE
Glucose, UA: NEGATIVE mg/dL
Hgb urine dipstick: NEGATIVE
Ketones, ur: 20 mg/dL — AB
Leukocytes, UA: NEGATIVE
Nitrite: NEGATIVE
Protein, ur: NEGATIVE mg/dL
Specific Gravity, Urine: 1.018 (ref 1.005–1.030)
pH: 6 (ref 5.0–8.0)

## 2018-11-23 LAB — I-STAT BETA HCG BLOOD, ED (MC, WL, AP ONLY): I-stat hCG, quantitative: <5 m[IU]/mL (ref <5)

## 2018-11-23 LAB — BASIC METABOLIC PANEL
Anion gap: 12 (ref 5–15)
BUN: 13 mg/dL (ref 6–20)
CHLORIDE: 95 mmol/L — AB (ref 98–111)
CO2: 30 mmol/L (ref 22–32)
CREATININE: 0.83 mg/dL (ref 0.44–1.00)
Calcium: 9.2 mg/dL (ref 8.9–10.3)
GFR calc Af Amer: >60 mL/min (ref >60)
GFR calc non Af Amer: >60 mL/min (ref >60)
GLUCOSE: 116 mg/dL — AB (ref 70–99)
POTASSIUM: 3.4 mmol/L — AB (ref 3.5–5.1)
Sodium: 137 mmol/L (ref 135–145)

## 2018-11-23 LAB — I-STAT TROPONIN, ED: Troponin i, poc: 0 ng/mL (ref 0.00–0.08)

## 2018-11-23 MED ORDER — ONDANSETRON HCL 4 MG/2ML IJ SOLN
4.0000 mg | Freq: Once | INTRAMUSCULAR | Status: AC
  Administered 2018-11-23: 4 mg via INTRAVENOUS
  Filled 2018-11-23: qty 2

## 2018-11-23 MED ORDER — KETOROLAC TROMETHAMINE 30 MG/ML IJ SOLN
30.0000 mg | Freq: Once | INTRAMUSCULAR | Status: AC
  Administered 2018-11-23: 30 mg via INTRAVENOUS
  Filled 2018-11-23: qty 1

## 2018-11-23 MED ORDER — CEPHALEXIN 250 MG PO CAPS
500.0000 mg | ORAL_CAPSULE | Freq: Once | ORAL | Status: AC
  Administered 2018-11-23: 500 mg via ORAL
  Filled 2018-11-23: qty 2

## 2018-11-23 MED ORDER — SODIUM CHLORIDE 0.9 % IV SOLN: 8.0000 mg | Freq: Once | INTRAVENOUS | Status: DC

## 2018-11-23 MED ORDER — OXYCODONE-ACETAMINOPHEN 5-325 MG PO TABS
1.0000 | ORAL_TABLET | Freq: Once | ORAL | Status: AC
  Administered 2018-11-23: 1 via ORAL
  Filled 2018-11-23: qty 1

## 2018-11-23 MED ORDER — CEPHALEXIN 500 MG PO CAPS: 500.0000 mg | ORAL_CAPSULE | Freq: Four times a day (QID) | ORAL | 0 refills | Status: AC

## 2018-11-23 MED ORDER — OXYCODONE-ACETAMINOPHEN 5-325 MG PO TABS: 1.0000 | ORAL_TABLET | Freq: Four times a day (QID) | ORAL | 0 refills | Status: AC | PRN

## 2018-11-23 NOTE — Discharge Instructions (Addendum)
Thank you for allowing [place] to be part of your care.   Please start:  Cephalexin (KEFLEX) 500 mg tablet four times per day, every six hours   Oxycodone-acetaminophen (PERCOCET) 5-325 mg tablet every six hours for left side pain  If your pain worsens, develop fever, shortness of breath, or other concerns, please return to the emergency department.   Please call your primary care physician for follow-up.

## 2018-11-23 NOTE — ED Notes (Signed)
Patient verbalizes understanding of medications and discharge instructions. No further questions at this time. VSS and patient ambulatory at discharge.   

## 2018-11-23 NOTE — ED Triage Notes (Signed)
Pt endorses left sided flank pain and left sided chest pain x 2 days with some shob. Denies urinary sx, n/v or dizziness.

## 2018-11-23 NOTE — ED Provider Notes (Addendum)
[facility] Provider Note   CSN: [id number] Arrival date & time: 11/23/18  1802     History   Chief Complaint Chief Complaint  Patient presents with  . Flank Pain  . Abdominal Pain    left side    HPI Whitney Boyer is a 52 y.o. female with PMH of uterine fibroids with post menopausal bleeding, HTN, GERD, and arthritis presenting today with left flank pain for two days. She states the pain is sharp and constant and radiates up her back. Nothing has relieved the pain. She endorses chest pain, denies radiating arm pain or jaw pain, denies nausea, SOB She denies dysuria, changes in urination, changes in BM. Last BM yesterday. She states she has been drinking lots of fluids and has been able to eat.   The history is provided by the patient.    Past Medical History:  Diagnosis Date  . Arthritis   . Asthma   . Depression   . GERD (gastroesophageal reflux disease)   . Hypertension   . Myocardial infarction ([place]) 10/2012  . Obesity     Patient Active Problem List   Diagnosis Date Noted  . BURSITIS, RIGHT HIP 06/30/2010  . CHEST WALL PAIN, ACUTE 06/30/2010  . SINUSITIS, ACUTE 05/28/2010  . FIBROIDS, UTERUS 10/27/2009  . ABNORMAL VAGINAL BLEEDING 12/10/2008  . VAGINAL DISCHARGE 10/01/2008  . DYSLIPIDEMIA 07/19/2008  . ANEMIA, IRON DEFICIENCY 06/02/2008  . ANXIETY DEPRESSION 05/10/2008  . HYPERTENSION 05/10/2008  . CHEST PAIN 05/10/2008  . DEGENERATIVE JOINT DISEASE, HIPS 11/30/2007  . DEGENERATIVE JOINT DISEASE, KNEES, BILATERAL 11/30/2007  . LATERAL EPICONDYLITIS, RIGHT 11/30/2007  . SNORING 11/30/2007  . OBESITY 09/05/2007  . GERD 09/05/2007  . PELVIC INFLAMMATORY DISEASE 09/05/2007    Past Surgical History:  Procedure Laterality Date  . FRACTURE SURGERY  2004   broken finger repair  . TUBAL LIGATION  1999     OB History    Gravida  2   Para  2   Term      Preterm      AB      Living  2     SAB     TAB      Ectopic      Multiple      Live Births               Home Medications    Prior to Admission medications   Medication Sig Start Date End Date Taking? Authorizing Provider  acetaminophen (TYLENOL) 325 MG tablet Take 2 tablets (650 mg total) by mouth every 6 (six) hours as needed for mild pain or moderate pain. 08/19/17   Waynetta Pean, PA-C  cephALEXin (KEFLEX) 500 MG capsule Take 1 capsule (500 mg total) by mouth 4 (four) times daily for 10 days. 11/23/18 12/03/18  Seawell, Jaimie A, DO  cyclobenzaprine (FLEXERIL) 10 MG tablet Take 1 tablet (10 mg total) by mouth 2 (two) times daily as needed for muscle spasms. 04/01/18   Curatolo, Adam, DO  diclofenac sodium (VOLTAREN) 1 % GEL Apply 2 g topically 4 (four) times daily. 07/07/15   Antonietta Breach, PA-C  methocarbamol (ROBAXIN) 500 MG tablet Take 1 tablet (500 mg total) by mouth 2 (two) times daily as needed for muscle spasms. 08/19/17   Waynetta Pean, PA-C  ondansetron (ZOFRAN ODT) 4 MG disintegrating tablet Take 1 tablet (4 mg total) by mouth every 8 (eight) hours as needed for nausea. Patient not taking: Reported on 07/07/2015 03/12/15  Julianne Rice, MD  oxyCODONE-acetaminophen (PERCOCET) 5-325 MG tablet Take 1 tablet by mouth every 6 (six) hours as needed for up to 3 days for severe pain. 11/23/18 11/26/18  Seawell, Jaimie A, DO  potassium chloride SA (KLOR-CON M20) 20 MEQ tablet Take 1 tablet (20 mEq total) by mouth daily. 03/15/18   Hinda Kehr, MD    Family History No family history on file.  Social History Social History   Tobacco Use  . Smoking status: Never Smoker  . Smokeless tobacco: Never Used  Substance Use Topics  . Alcohol use: No  . Drug use: No     Allergies   Doxycycline and Morphine and related   Review of Systems Review of Systems  Constitutional: Positive for chills and fever. Negative for diaphoresis.  Respiratory: Negative for cough, chest tightness and shortness of breath.     Cardiovascular: Positive for chest pain.  Gastrointestinal: Positive for abdominal pain. Negative for abdominal distention, constipation, diarrhea, nausea and vomiting.  Genitourinary: Positive for flank pain. Negative for decreased urine volume, difficulty urinating, dysuria, hematuria, pelvic pain and urgency.  Musculoskeletal: Positive for back pain. Negative for gait problem, joint swelling and neck pain.  Neurological: Negative for dizziness, light-headedness, numbness and headaches.  Ten systems reviewed and are negative for acute change, except as noted in the HPI.      Physical Exam Updated Vital Signs BP 122/66 (BP Location: Right Arm)   Pulse 95   Temp 98.4 F (36.9 C) (Oral)   Resp 15   Ht 5' (1.524 m)   Wt 117.9 kg   LMP 11/04/2011   SpO2 100%   BMI 50.78 kg/m   Physical Exam  Constitutional: She is oriented to person, place, and time. She appears well-developed and well-nourished. She appears distressed.  HENT:  Head: Normocephalic and atraumatic.  Cardiovascular: Normal rate and regular rhythm.  No murmur heard. Pulmonary/Chest: Effort normal and breath sounds normal. She has no wheezes. She has no rhonchi. She has no rales.  Abdominal: Soft. Bowel sounds are normal. There is tenderness in the left upper quadrant and left lower quadrant. There is no rigidity.  Musculoskeletal: She exhibits no edema or deformity.       Arms: Neurological: She is alert and oriented to person, place, and time.  Skin: Skin is warm and dry. No pallor.     ED Treatments / Results  Labs (all labs ordered are listed, but only abnormal results are displayed) Labs Reviewed  BASIC METABOLIC PANEL - Abnormal; Notable for the following components:      Result Value   Potassium 3.4 (*)    Chloride 95 (*)    Glucose, Bld 116 (*)    All other components within normal limits  CBC - Abnormal; Notable for the following components:   WBC 12.4 (*)    Platelets 427 (*)    All other  components within normal limits  URINALYSIS, ROUTINE W REFLEX MICROSCOPIC - Abnormal; Notable for the following components:   Ketones, ur 20 (*)    All other components within normal limits  URINE CULTURE  I-STAT TROPONIN, ED  I-STAT BETA HCG BLOOD, ED ([facility], [facility], [facility] ONLY)    EKG None  Radiology Dg Chest 2 View  Result Date: 11/23/2018 CLINICAL DATA:  Left side flank pain, left side CP x2 days. No SOB. Hx of asthma, hypertension, MI(2013). Nonsmoker. EXAM: CHEST - 2 VIEW COMPARISON:  03/15/2018 FINDINGS: Heart size is normal. Lungs are free of focal consolidations and pleural  effusions. There is moderate midthoracic spondylosis. IMPRESSION: No active cardiopulmonary disease. Electronically Signed   By: Nolon Nations M.D.   On: 11/23/2018 19:58   Ct Renal Stone Study  Result Date: 11/23/2018 CLINICAL DATA:  Left side flank pain EXAM: CT ABDOMEN AND PELVIS WITHOUT CONTRAST TECHNIQUE: Multidetector CT imaging of the abdomen and pelvis was performed following the standard protocol without IV contrast. COMPARISON:  None. FINDINGS: Lower chest: Lung bases are clear. No effusions. Heart is normal size. Hepatobiliary: No focal hepatic abnormality. Gallbladder unremarkable. Pancreas: No focal abnormality or ductal dilatation. Spleen: No focal abnormality.  Normal size. Adrenals/Urinary Tract: No renal or ureteral stones. No hydronephrosis. Adrenal glands and urinary bladder unremarkable. There is stranding in the left retroperitoneum adjacent to the medial aspect of the left kidney. This does not appear to surround the kidney but is noted more centrally, between the left kidney in the aorta. Adjacent disc spaces appear normal. Stomach/Bowel: Scattered colonic diverticulosis. No active diverticulitis. Stomach and small bowel decompressed. No evidence of bowel obstruction. Vascular/Lymphatic: No evidence of aneurysm or adenopathy. Reproductive: Uterus and adnexa unremarkable.  No mass. Other: No free fluid  or free air. Musculoskeletal: No acute bony abnormality. IMPRESSION: Stranding/edema adjacent to the medial aspect of the left kidney between the left kidney and aorta. There is no hydronephrosis, renal or ureteral stones. This conceivably could be related to pyelonephritis. Recommend clinical correlation and correlation with urinalysis. If felt clinically indicated, contrast-enhanced CT may be helpful to better evaluate the left kidney. Scattered colonic diverticulosis.  No active diverticulitis. Electronically Signed   By: Rolm Baptise M.D.   On: 11/23/2018 20:49    Procedures Procedures (including critical care time)  Medications Ordered in ED Medications  ketorolac (TORADOL) 30 MG/ML injection 30 mg (30 mg Intravenous Given 11/23/18 2018)  ondansetron (ZOFRAN) injection 4 mg (4 mg Intravenous Given 11/23/18 2117)  oxyCODONE-acetaminophen (PERCOCET/ROXICET) 5-325 MG per tablet 1 tablet (1 tablet Oral Given 11/23/18 2150)  cephALEXin (KEFLEX) capsule 500 mg (500 mg Oral Given 11/23/18 2151)     Initial Impression / Assessment and Plan / ED Course  I have reviewed the triage vital signs and the nursing notes.  Pertinent labs & imaging results that were available during my care of the patient were reviewed by me and considered in my medical decision making (see chart for details).  Clinical Course as of Nov 23 2350  Thu Nov 23, 2018  2000 Persistent eft flank pain with CVA tenderness, mild leukocytosis. No dysuria currently but symptoms consistent with possible nephro vs. Pyelo. Stone CT ordered.    [JS]  2101 CT showing possible left pyelonephritis, no nephrolithiasis. UA unremarkable, showing only ketones, sent for culture. She is without signs or symptoms of sepsis and stable. Pain relieved with percocet. Likely early pyelonephritis with mild leukocytosis and CT findings. Prescribed keflex 500 mg 10 days and percocet for pain with instructions to return if symptoms worsen and f/u with PCP.     [JS]    Clinical Course User Index [JS] Seawell, Jaimie A, DO      Final Clinical Impressions(s) / ED Diagnoses   Final diagnoses:  Left flank pain  Left lower quadrant abdominal pain  Other acute back pain    ED Discharge Orders         Ordered    oxyCODONE-acetaminophen (PERCOCET) 5-325 MG tablet  Every 6 hours PRN     11/23/18 2258    cephALEXin (KEFLEX) 500 MG capsule  4 times daily  11/23/18 2258           Molli Hazard A, DO 11/23/18 2255    Marty Heck, DO 11/23/18 2352    Jola Schmidt, MD 11/24/18 [phone]

## 2018-11-23 NOTE — ED Triage Notes (Signed)
Pt. Stated, Ive had left side pain with back pain for 2 days.

## 2018-11-25 LAB — URINE CULTURE: Culture: <10000 — AB

## 2023-07-10 ENCOUNTER — Other Ambulatory Visit: Payer: Self-pay

## 2023-07-10 ENCOUNTER — Encounter: Payer: Self-pay | Admitting: Emergency Medicine

## 2023-07-10 ENCOUNTER — Emergency Department
Admission: EM | Admit: 2023-07-10 | Discharge: 2023-07-10 | Disposition: A | Discharge status: Home / Self Care | Payer: Medicaid Other | Attending: Emergency Medicine | Admitting: Emergency Medicine

## 2023-07-10 ENCOUNTER — Emergency Department: Payer: Medicaid Other

## 2023-07-10 DIAGNOSIS — S0993XA Unspecified injury of face, initial encounter: Secondary | ICD-10-CM | POA: Insufficient documentation

## 2023-07-10 DIAGNOSIS — J45909 Unspecified asthma, uncomplicated: Secondary | ICD-10-CM | POA: Insufficient documentation

## 2023-07-10 DIAGNOSIS — I10 Essential (primary) hypertension: Secondary | ICD-10-CM | POA: Insufficient documentation

## 2023-07-10 DIAGNOSIS — S0990XA Unspecified injury of head, initial encounter: Secondary | ICD-10-CM | POA: Diagnosis present

## 2023-07-10 MED ORDER — ACETAMINOPHEN 500 MG PO TABS
ORAL_TABLET | ORAL | Status: AC
  Filled 2023-07-10: qty 2

## 2023-07-10 MED ORDER — ACETAMINOPHEN 500 MG PO TABS
1000.0000 mg | ORAL_TABLET | Freq: Once | ORAL | Status: AC
  Administered 2023-07-10: 1000 mg via ORAL

## 2023-07-10 NOTE — ED Notes (Signed)
Patient declined discharge vital signs. 
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
## 2024-09-15 ENCOUNTER — Emergency Department
Admission: EM | Admit: 2024-09-15 | Discharge: 2024-09-15 | Disposition: A | Discharge status: Home / Self Care | Attending: Emergency Medicine | Admitting: Emergency Medicine

## 2024-09-15 DIAGNOSIS — M25512 Pain in left shoulder: Secondary | ICD-10-CM

## 2024-09-15 DIAGNOSIS — M75102 Unspecified rotator cuff tear or rupture of left shoulder, not specified as traumatic: Secondary | ICD-10-CM | POA: Insufficient documentation

## 2024-09-15 MED ORDER — PREDNISONE 10 MG PO TABS
10.0000 mg | ORAL_TABLET | Freq: Every day | ORAL | 0 refills | Status: AC
Start: 2024-09-15 — End: ?

## 2024-09-15 NOTE — ED Triage Notes (Signed)
 Pt c/o L upper arm pain x1 day.  Pt reports she was at work when the pain started.  Denies injury, pushing, pulling, and lifting.  Pain score 9/10.  Pt reports pain increases w/ movement.

## 2024-09-15 NOTE — Discharge Instructions (Signed)
 Please take prednisone  as prescribed.  You may take Tylenol  for additional pain relief.  You may apply topical Voltaren  gel and ice throughout the day as needed.  Avoid any overhead lifting pushing and pulling.  Return to the ER for any worsening symptoms or any urgent changes in your health

## 2024-09-15 NOTE — ED Provider Notes (Addendum)
 [facility] AT [facility] Provider Note   CSN: [id number] Arrival date & time: 09/15/24  9156     Patient presents with: Arm Pain   Whitney Boyer is a 58 y.o. female.  With no stated past medical history presents to the emergency department for evaluation of left shoulder pain.  Pains been present for 1 day.  No known trauma or injury.  She points to the left greater tuberosity and states the pain radiates to her elbow.  Pain is present when she lifts her elbow above her shoulder only.  She has no pain with keeping her elbow by her side.  She denies any numbness, tingling, radicular symptoms.  No neck pain.  No weakness in the left or right upper extremity.  No chest pain diaphoresis nausea vomiting or shortness of breath.  She has been taking Tylenol  with no relief.     Prior to Admission medications   Medication Sig Start Date End Date Taking? Authorizing Provider  predniSONE  (DELTASONE ) 10 MG tablet Take 1 tablet (10 mg total) by mouth daily. 6,5,4,3,2,1 six day taper 09/15/24  Yes Sota Hetz C, PA-C  acetaminophen  (TYLENOL ) 325 MG tablet Take 2 tablets (650 mg total) by mouth every 6 (six) hours as needed for mild pain or moderate pain. 08/19/17   Dansie, William, PA-C  cyclobenzaprine  (FLEXERIL ) 10 MG tablet Take 1 tablet (10 mg total) by mouth 2 (two) times daily as needed for muscle spasms. 04/01/18   Curatolo, Adam, DO  diclofenac  sodium (VOLTAREN ) 1 % GEL Apply 2 g topically 4 (four) times daily. 07/07/15   Keith Sor, PA-C  methocarbamol  (ROBAXIN ) 500 MG tablet Take 1 tablet (500 mg total) by mouth 2 (two) times daily as needed for muscle spasms. 08/19/17   Dansie, William, PA-C  ondansetron  (ZOFRAN  ODT) 4 MG disintegrating tablet Take 1 tablet (4 mg total) by mouth every 8 (eight) hours as needed for nausea. Patient not taking: Reported on 07/07/2015 03/12/15   Carlyle Lenis, MD  potassium chloride  SA (KLOR-CON  M20) 20 MEQ tablet Take 1  tablet (20 mEq total) by mouth daily. 03/15/18   Gordan Huxley, MD    Allergies: Doxycycline and Morphine and codeine    Review of Systems  Updated Vital Signs BP (!) 147/82 (BP Location: Right Arm)   Pulse 77   Temp 98 F (36.7 C) (Oral)   Resp 18   Ht 5' (1.524 m)   Wt 120.2 kg   LMP 11/04/2011   SpO2 96%   BMI 51.75 kg/m   Physical Exam Constitutional:      Appearance: She is well-developed.  HENT:     Head: Normocephalic and atraumatic.  Eyes:     Conjunctiva/sclera: Conjunctivae normal.  Cardiovascular:     Rate and Rhythm: Normal rate and regular rhythm.     Pulses: Normal pulses.     Heart sounds: Normal heart sounds.  Pulmonary:     Effort: Pulmonary effort is normal. No respiratory distress.     Breath sounds: Normal breath sounds. No wheezing or rales.  Abdominal:     General: There is no distension.     Tenderness: There is no abdominal tenderness. There is no guarding.  Musculoskeletal:        General: Normal range of motion.     Cervical back: Normal range of motion.     Comments: No cervical spinous process tenderness.  Normal cervical range of motion.  Negative Spurling's test.  Active left shoulder range of  motion to 95 degrees of abduction, passively I can get her to 120.  She has mild pain with external rotation of the left shoulder.  Moderate to severe pain with Hawkins, empty can test.  Negative drop arm test.  She is neuro vas intact in left upper extremity and has 5 out of 5 strength with biceps, triceps, grip strength.  She has 4-5 strength with resisted supraspinatus strength testing.  2+ radial pulse.  No swelling throughout the left upper extremity.  Skin:    General: Skin is warm.     Capillary Refill: Capillary refill takes less than 2 seconds.     Findings: No rash.  Neurological:     General: No focal deficit present.     Mental Status: She is alert and oriented to person, place, and time.  Psychiatric:        Behavior: Behavior normal.         Thought Content: Thought content normal.     (all labs ordered are listed, but only abnormal results are displayed) Labs Reviewed - No data to display  EKG: None  Radiology: No results found.   Procedures   Medications Ordered in the ED - No data to display                                  Medical Decision Making Risk Prescription drug management.   58 year old female with left shoulder pain.  Vital signs stable, afebrile nontachycardic.  No chest pain or shortness of breath.  Her history, physical exam findings are consistent with rotator cuff tendinitis.  She has positive impingement signs which are the only movements that reproduce her pain.  She has no pain with rest and keeping her arm by her side.  No neurological deficits.  No signs of cervical radiculopathy or myelopathy.  She will continue with Tylenol  but we will start her on a prednisone  taper for 6 days.  We discussed activity modification and follow-up with orthopedics in 1 week if no improvement.  She is also given strict return precautions understands signs and symptoms return to the ER for. Final diagnoses:  Rotator cuff syndrome, left  Acute pain of left shoulder    ED Discharge Orders          Ordered    predniSONE  (DELTASONE ) 10 MG tablet  Daily        09/15/24 0913               Charlene Debby BROCKS, PA-C 09/15/24 0918    Charlene Debby BROCKS, PA-C 09/15/24 9081    Suzanne Kirsch, MD 09/15/24 [phone]
